# Patient Record
Sex: Male | Born: 1935 | Race: White | Hispanic: No | Marital: Married | Smoking: Current some day smoker
Health system: Southern US, Community
[De-identification: ages and names within clinical notes are randomized; demographics above are authoritative.]

## PROBLEM LIST (undated history)

## (undated) DIAGNOSIS — N200 Calculus of kidney: Secondary | ICD-10-CM

## (undated) DIAGNOSIS — I639 Cerebral infarction, unspecified: Secondary | ICD-10-CM

## (undated) DIAGNOSIS — I10 Essential (primary) hypertension: Secondary | ICD-10-CM

## (undated) HISTORY — PX: CATARACT EXTRACTION: SUR2

## (undated) HISTORY — DX: Calculus of kidney: N20.0

## (undated) HISTORY — DX: Cerebral infarction, unspecified: I63.9

## (undated) HISTORY — PX: CHOLECYSTECTOMY: SHX55

## (undated) HISTORY — DX: Essential (primary) hypertension: I10

---

## 1990-10-22 DIAGNOSIS — I639 Cerebral infarction, unspecified: Secondary | ICD-10-CM

## 1990-10-22 HISTORY — DX: Cerebral infarction, unspecified: I63.9

## 1998-06-21 ENCOUNTER — Inpatient Hospital Stay (HOSPITAL_COMMUNITY): Admission: AD | Admit: 1998-06-21 | Discharge: 1998-06-26 | Payer: Self-pay | Admitting: Otolaryngology

## 1998-07-01 ENCOUNTER — Ambulatory Visit (HOSPITAL_BASED_OUTPATIENT_CLINIC_OR_DEPARTMENT_OTHER): Admission: RE | Admit: 1998-07-01 | Discharge: 1998-07-01 | Payer: Self-pay | Admitting: Otolaryngology

## 2002-02-20 ENCOUNTER — Encounter: Payer: Self-pay | Admitting: Emergency Medicine

## 2002-02-20 ENCOUNTER — Encounter: Payer: Self-pay | Admitting: Neurology

## 2002-02-20 ENCOUNTER — Inpatient Hospital Stay (HOSPITAL_COMMUNITY): Admission: EM | Admit: 2002-02-20 | Discharge: 2002-02-24 | Payer: Self-pay | Admitting: Ophthalmology

## 2002-02-22 ENCOUNTER — Encounter: Payer: Self-pay | Admitting: Neurology

## 2002-02-24 ENCOUNTER — Encounter: Payer: Self-pay | Admitting: Neurology

## 2002-02-24 ENCOUNTER — Inpatient Hospital Stay (HOSPITAL_COMMUNITY)
Admission: AD | Admit: 2002-02-24 | Discharge: 2002-03-13 | Payer: Self-pay | Admitting: Physical Medicine & Rehabilitation

## 2002-04-13 ENCOUNTER — Encounter: Admission: RE | Admit: 2002-04-13 | Discharge: 2002-05-13 | Payer: Self-pay | Admitting: Neurology

## 2006-02-20 ENCOUNTER — Encounter: Payer: Self-pay | Admitting: Otolaryngology

## 2006-03-06 ENCOUNTER — Encounter (INDEPENDENT_AMBULATORY_CARE_PROVIDER_SITE_OTHER): Payer: Self-pay | Admitting: Specialist

## 2006-03-07 ENCOUNTER — Inpatient Hospital Stay (HOSPITAL_COMMUNITY): Admission: AD | Admit: 2006-03-07 | Discharge: 2006-03-12 | Payer: Self-pay | Admitting: General Surgery

## 2006-10-30 ENCOUNTER — Ambulatory Visit (HOSPITAL_COMMUNITY): Admission: RE | Admit: 2006-10-30 | Discharge: 2006-10-30 | Payer: Self-pay | Admitting: Family Medicine

## 2006-11-04 ENCOUNTER — Ambulatory Visit: Payer: Self-pay | Admitting: Internal Medicine

## 2006-11-06 ENCOUNTER — Encounter (INDEPENDENT_AMBULATORY_CARE_PROVIDER_SITE_OTHER): Payer: Self-pay | Admitting: *Deleted

## 2006-11-06 ENCOUNTER — Ambulatory Visit (HOSPITAL_COMMUNITY): Admission: RE | Admit: 2006-11-06 | Discharge: 2006-11-06 | Payer: Self-pay | Admitting: Internal Medicine

## 2006-11-11 ENCOUNTER — Ambulatory Visit: Payer: Self-pay | Admitting: Internal Medicine

## 2006-11-12 ENCOUNTER — Ambulatory Visit: Payer: Self-pay | Admitting: Internal Medicine

## 2006-11-12 LAB — CONVERTED CEMR LAB
Alkaline Phosphatase: 244 units/L — ABNORMAL HIGH (ref 39–117)
Total Bilirubin: 2.7 mg/dL — ABNORMAL HIGH (ref 0.3–1.2)
Total Protein: 6.9 g/dL (ref 6.0–8.3)

## 2006-12-12 ENCOUNTER — Encounter (INDEPENDENT_AMBULATORY_CARE_PROVIDER_SITE_OTHER): Payer: Self-pay | Admitting: Specialist

## 2006-12-12 ENCOUNTER — Ambulatory Visit (HOSPITAL_COMMUNITY): Admission: RE | Admit: 2006-12-12 | Discharge: 2006-12-12 | Payer: Self-pay | Admitting: Gastroenterology

## 2006-12-19 ENCOUNTER — Ambulatory Visit: Payer: Self-pay | Admitting: Gastroenterology

## 2006-12-31 ENCOUNTER — Ambulatory Visit: Payer: Self-pay | Admitting: Internal Medicine

## 2006-12-31 LAB — CONVERTED CEMR LAB
AST: 29 units/L (ref 0–37)
Alkaline Phosphatase: 104 units/L (ref 39–117)
Bilirubin, Direct: 0.5 mg/dL — ABNORMAL HIGH (ref 0.0–0.3)
Total Bilirubin: 1.2 mg/dL (ref 0.3–1.2)
Total Protein: 7.2 g/dL (ref 6.0–8.3)

## 2007-02-03 ENCOUNTER — Ambulatory Visit (HOSPITAL_COMMUNITY): Admission: RE | Admit: 2007-02-03 | Discharge: 2007-02-03 | Payer: Self-pay | Admitting: Family Medicine

## 2008-12-16 ENCOUNTER — Encounter: Payer: Self-pay | Admitting: Family Medicine

## 2009-01-12 ENCOUNTER — Emergency Department (HOSPITAL_COMMUNITY): Admission: EM | Admit: 2009-01-12 | Discharge: 2009-01-12 | Payer: Self-pay | Admitting: Emergency Medicine

## 2009-04-22 ENCOUNTER — Ambulatory Visit: Payer: Self-pay | Admitting: Family Medicine

## 2009-04-22 DIAGNOSIS — Z8679 Personal history of other diseases of the circulatory system: Secondary | ICD-10-CM | POA: Insufficient documentation

## 2009-04-22 DIAGNOSIS — I10 Essential (primary) hypertension: Secondary | ICD-10-CM

## 2009-06-03 DIAGNOSIS — F068 Other specified mental disorders due to known physiological condition: Secondary | ICD-10-CM

## 2009-10-26 ENCOUNTER — Ambulatory Visit: Payer: Self-pay | Admitting: Family Medicine

## 2009-10-26 DIAGNOSIS — L851 Acquired keratosis [keratoderma] palmaris et plantaris: Secondary | ICD-10-CM

## 2009-10-26 DIAGNOSIS — L570 Actinic keratosis: Secondary | ICD-10-CM

## 2009-10-26 DIAGNOSIS — M545 Low back pain, unspecified: Secondary | ICD-10-CM | POA: Insufficient documentation

## 2009-10-26 DIAGNOSIS — R252 Cramp and spasm: Secondary | ICD-10-CM

## 2009-10-27 LAB — CONVERTED CEMR LAB
CO2: 32 meq/L (ref 19–32)
Chloride: 106 meq/L (ref 96–112)
Potassium: 3.4 meq/L — ABNORMAL LOW (ref 3.5–5.1)

## 2010-03-09 ENCOUNTER — Telehealth: Payer: Self-pay | Admitting: Family Medicine

## 2010-04-26 ENCOUNTER — Ambulatory Visit: Payer: Self-pay | Admitting: Family Medicine

## 2010-04-26 DIAGNOSIS — E876 Hypokalemia: Secondary | ICD-10-CM | POA: Insufficient documentation

## 2010-04-27 LAB — CONVERTED CEMR LAB
BUN: 16 mg/dL (ref 6–23)
Calcium: 9 mg/dL (ref 8.4–10.5)
Chloride: 107 meq/L (ref 96–112)
Cholesterol: 151 mg/dL (ref 0–200)
Creatinine, Ser: 1 mg/dL (ref 0.4–1.5)
Glucose, Bld: 96 mg/dL (ref 70–99)
HDL: 42.9 mg/dL (ref >39.00)
Potassium: 3.6 meq/L (ref 3.5–5.1)
Sodium: 147 meq/L — ABNORMAL HIGH (ref 135–145)
Total CHOL/HDL Ratio: 4

## 2010-10-27 ENCOUNTER — Ambulatory Visit
Admission: RE | Admit: 2010-10-27 | Discharge: 2010-10-27 | Payer: Self-pay | Source: Home / Self Care | Attending: Family Medicine | Admitting: Family Medicine

## 2010-10-27 DIAGNOSIS — L259 Unspecified contact dermatitis, unspecified cause: Secondary | ICD-10-CM | POA: Insufficient documentation

## 2010-11-21 NOTE — Assessment & Plan Note (Signed)
Summary: 6 MONTH ROV/NJR   Vital Signs:  Patient profile:   77 year old male Weight:      165 pounds Temp:     98.1 degrees F oral BP sitting:   160 / 60  (left arm) Cuff size:   regular  Vitals Entered By: Kathrynn Speed CMA (April 26, 2010 10:16 AM) CC: 6 month rov /sr, Hypertension Management  Does patient need assistance? Ambulation Impaired:Risk for fall   Serial Vital Signs/Assessments:  Time      Position  BP       Pulse  Resp  Temp     By                     138/80                         Evelena Peat MD   History of Present Illness: Patient here for followup hypertension. Past history remotely of cerebrovascular accident. Patient also has some cognitive decline that family not sure of the benefit of Aricept and he would like to discontinue. History of slightly low potassium of 3.4  last visit. Patient eats one banana per day. Excellent appetite.  Occasional dizziness with standing. He walks with a cane. No recent falls. Denies chest pain. Lipids have been OK in past but not checked in some time.  Hypertension History:      He denies headache, chest pain, palpitations, dyspnea with exertion, orthopnea, peripheral edema, visual symptoms, neurologic problems, and syncope.        Positive major cardiovascular risk factors include male age 44 years old or older, hypertension, and current tobacco user.     Current Medications (verified): 1)  Plavix 75 Mg Tabs (Clopidogrel Bisulfate) .... Once Daily 2)  Lisinopril-Hydrochlorothiazide 20-12.5 Mg Tabs (Lisinopril-Hydrochlorothiazide) .... Once Daily 3)  Aricept 10 Mg Tabs (Donepezil Hcl) .... As Needed 4)  Aristospan Intralesional 5 Mg/ml Susp (Triamcinolone Hexacetonide) 5)  Triamcinolone Acetonide 0.1 % Crea (Triamcinolone Acetonide) .... Apply To Dry Skin As Needed As Directed  Allergies (verified): No Known Drug Allergies  Past History:  Past Medical History: Last updated: 04/22/2009 Chicken  pox Hypertension Cerebrovascular accident, hx of Kidney stones  Past Surgical History: Last updated: 04/22/2009 Cholecystectomy 2007  Social History: Last updated: 04/22/2009 Retired Married Current Smoker, 3-4 daily Alcohol use-no PMH-FH-SH reviewed for relevance  Review of Systems  The patient denies anorexia, fever, weight loss, chest pain, dyspnea on exertion, prolonged cough, headaches, hemoptysis, abdominal pain, melena, and hematochezia.    Physical Exam  General:  Well-developed,well-nourished,in no acute distress; alert,appropriate and cooperative throughout examination Head:  Normocephalic and atraumatic without obvious abnormalities. No apparent alopecia or balding. Ears:  External ear exam shows no significant lesions or deformities.  Otoscopic examination reveals clear canals, tympanic membranes are intact bilaterally without bulging, retraction, inflammation or discharge. Hearing is grossly normal bilaterally. Mouth:  Oral mucosa and oropharynx without lesions or exudates.  Teeth in good repair. Neck:  No deformities, masses, or tenderness noted. Lungs:  Normal respiratory effort, chest expands symmetrically. Lungs are clear to auscultation, no crackles or wheezes. Heart:  normal rate and regular rhythm.   Extremities:  no edema Neurologic:  cranial nerves II-XII intact and strength normal in all extremities.   Psych:  normally interactive, good eye contact, not anxious appearing, and not depressed appearing.     Impression & Recommendations:  Problem # 1:  HYPERTENSION (ICD-401.9)  His updated medication  list for this problem includes:    Lisinopril-hydrochlorothiazide 20-12.5 Mg Tabs (Lisinopril-hydrochlorothiazide) ..... Once daily  Problem # 2:  DEMENTIA (ICD-294.8) Family and pt wish to d/c Aricept.  Problem # 3:  HYPOKALEMIA (ICD-276.8)  Orders: Venipuncture (29562) TLB-BMP (Basic Metabolic Panel-BMET) (80048-METABOL)  Problem # 4:   CEREBROVASCULAR ACCIDENT, HX OF (ICD-V12.50) recheck lipids.  Cont plavix.  Pt is encouraged to stop smoking. Orders: Venipuncture (13086) TLB-Lipid Panel (80061-LIPID) Prescription Created Electronically [phone])  Complete Medication List: 1)  Plavix 75 Mg Tabs (Clopidogrel bisulfate) .... Once daily 2)  Lisinopril-hydrochlorothiazide 20-12.5 Mg Tabs (Lisinopril-hydrochlorothiazide) .... Once daily 3)  Aristospan Intralesional 5 Mg/ml Susp (Triamcinolone hexacetonide) 4)  Triamcinolone Acetonide 0.1 % Crea (Triamcinolone acetonide) .... Apply to dry skin as needed as directed  Hypertension Assessment/Plan:      The patient's hypertensive risk group is category B: At least one risk factor (excluding diabetes) with no target organ damage.  Today's blood pressure is 160/60.    Patient Instructions: 1)  Please schedule a follow-up appointment in 6 months .  2)  Consider discontinuing Aricept Prescriptions: PLAVIX 75 MG TABS (CLOPIDOGREL BISULFATE) once daily  #30 x 11   Entered and Authorized by:   Evelena Peat MD   Signed by:   Evelena Peat MD on 04/26/2010   Method used:   [facility] to        [facility] [phone]* (retail)       [address]       [place], [place]  [address]       Ph: [phone] or [phone]       Fax: [phone]   RxID:   [id number]

## 2010-11-21 NOTE — Assessment & Plan Note (Signed)
Summary: 6 month roa//LH....PT [facility] // RS   Vital Signs:  Patient profile:   77 year old male Weight:      170 pounds Temp:     97.9 degrees F oral BP sitting:   130 / 74  (left arm) Cuff size:   regular  Vitals Entered By: Sid Falcon LPN (October 26, 2009 11:50 AM) CC: 6 month follow-up, Hypertension Management   History of Present Illness: Patient here for medical followup. Problems include history of mild cognitive impairment, stroke, and hypertension. Medications are all reviewed. No side effects. Compliant with medications.  Denies any recent chest pain, falls, dyspnea, claudication symptoms, or dysuria. History of dry skin and request refill triamcinolone ointment.  Patient has history of frequent leg cramps at night. Decreased fluid intake at night. No obstructive urine symptoms. No recent electrolytes or blood work.  still smokes less than one half pack cigarettes per day.  He has some low back pains which seem to be somewhat worse with walking. Takes Tylenol. No difficulty sleeping related to this. This is a dull achy pain. No weakness or numbness lower extremities no loss of bladder or bowel control  Hypertension History:      He denies headache, chest pain, palpitations, dyspnea with exertion, orthopnea, PND, peripheral edema, visual symptoms, neurologic problems, syncope, and side effects from treatment.  He notes no problems with any antihypertensive medication side effects.        Positive major cardiovascular risk factors include male age 52 years old or older, hypertension, and current tobacco user.     Allergies (verified): No Known Drug Allergies  Past History:  Past Medical History: Last updated: 04/22/2009 Chicken pox Hypertension Cerebrovascular accident, hx of Kidney stones  Past Surgical History: Last updated: 04/22/2009 Cholecystectomy 2007  Social History: Last updated: 04/22/2009 Retired Married Current Smoker, 3-4 daily Alcohol  use-no  Review of Systems  The patient denies anorexia, fever, weight loss, weight gain, chest pain, syncope, dyspnea on exertion, peripheral edema, prolonged cough, headaches, hemoptysis, abdominal pain, melena, hematochezia, incontinence, and difficulty walking.    Physical Exam  General:  alert cooperative in no distress Ears:  External ear exam shows no significant lesions or deformities.  Otoscopic examination reveals clear canals, tympanic membranes are intact bilaterally without bulging, retraction, inflammation or discharge. Hearing is grossly normal bilaterally. Mouth:  Oral mucosa and oropharynx without lesions or exudates.  Teeth in good repair. Neck:  No deformities, masses, or tenderness noted. Lungs:  somewhat diminished breath sounds throughout. No wheezes arousals noted. Heart:  occasional irregular beat. Normal rate. No gallop Extremities:  no peripheral edema noted Skin:  Area of hyperkeratotic skin dorsum right hand but no nodular or ulcerative changes. Psych:  normally interactive, good eye contact, not anxious appearing, and not depressed appearing.     Impression & Recommendations:  Problem # 1:  HYPERTENSION (ICD-401.9)  His updated medication list for this problem includes:    Lisinopril-hydrochlorothiazide 20-12.5 Mg Tabs (Lisinopril-hydrochlorothiazide) ..... Once daily  Orders: TLB-BMP (Basic Metabolic Panel-BMET) (80048-METABOL)  Problem # 2:  CEREBROVASCULAR ACCIDENT, HX OF (ICD-V12.50) Hx of.  Strongly encouraged to d/c smoking.  Problem # 3:  LEG CRAMPS (ICD-729.82)  increase fluid intake and check electrolytes  Orders: TLB-BMP (Basic Metabolic Panel-BMET) (80048-METABOL)  Problem # 4:  DRY SKIN (ICD-701.1) refill triamcinolone ointment  Problem # 5:  ACTINIC KERATOSIS (ICD-702.0) discussed treatment options including liquid nitrogen they wish to wait at this time  Problem # 6:  LUMBAGO (ICD-724.2) By  hx, most likely lumbar stenosis.   Discussed further evaluation and possible rx options and at this point he wishes to wait.  Problem # 7:  Preventive Health Care (ICD-V70.0) Still hasn't had flu vaccine but he consents today.  Complete Medication List: 1)  Plavix 75 Mg Tabs (Clopidogrel bisulfate) .... Once daily 2)  Lisinopril-hydrochlorothiazide 20-12.5 Mg Tabs (Lisinopril-hydrochlorothiazide) .... Once daily 3)  Aricept 10 Mg Tabs (Donepezil hcl) .... Once daily as directed 4)  Aristospan Intralesional 5 Mg/ml Susp (Triamcinolone hexacetonide)  Other Orders: Flu Vaccine 73yrs + (40981) Administration Flu vaccine - MCR (X9147)  Hypertension Assessment/Plan:      The patient's hypertensive risk group is category B: At least one risk factor (excluding diabetes) with no target organ damage.  Today's blood pressure is 130/74.    Patient Instructions: 1)  Please schedule a follow-up appointment in 6 months .  2)  Stop smoking tips: Choose a quit date. Cut down before the quit date. Decide what you will do as a substitute when you feel the urge to smoke(gum, toothpick, exercise).   Flu Vaccine Consent Questions     Do you have a history of severe allergic reactions to this vaccine? no    Any prior history of allergic reactions to egg and/or gelatin? no    Do you have a sensitivity to the preservative Thimersol? no    Do you have a past history of Guillan-Barre Syndrome? no    Do you currently have an acute febrile illness? no    Have you ever had a severe reaction to latex? no    Vaccine information given and explained to patient? yes    Are you currently pregnant? no    Lot Number:[id number]   Exp Date:04/20/2010   Site Given  Left Deltoid IMdflu

## 2010-11-21 NOTE — Progress Notes (Signed)
Summary: REFILL REQUEST (LISONO-HCTZ)  Phone Note Refill Request Message from:  Patient on Mar 09, 2010 11:04 AM  Refills Requested: Medication #1:  LISINOPRIL-HYDROCHLOROTHIAZIDE 20-12.5 MG TABS once daily   Notes: Q[facility] - 30.[facility] [place]    Initial call taken by: Debbra Riding,  Mar 09, 2010 11:05 AM    Prescriptions: LISINOPRIL-HYDROCHLOROTHIAZIDE 20-12.5 MG TABS (LISINOPRIL-HYDROCHLOROTHIAZIDE) once daily  #30 x 11   Entered by:   Sid Falcon LPN   Authorized by:   Evelena Peat MD   Signed by:   Sid Falcon LPN on 45/40/9811   Method used:   Electronically to        [facility]  [address]* (retail)       [address]       [place], [place]  [address]       Ph: [phone]       Fax: [phone]   RxID:   [id number] LISINOPRIL-HYDROCHLOROTHIAZIDE 20-12.5 MG TABS (LISINOPRIL-HYDROCHLOROTHIAZIDE) once daily  #30 x 11   Entered by:   Sid Falcon LPN   Authorized by:   Evelena Peat MD   Signed by:   Sid Falcon LPN on 66/44/0347   Method used:   [facility]        [facility] [phone]* (retail)       [address]       [place], [place]  [address]       Ph: [phone] or [phone]       Fax: [phone]   RxID:   [id number]  Pt changed pharmacy, cancelled Rx [facility] LPN  Mar 09, 2010 12:04 PM

## 2010-11-23 NOTE — Assessment & Plan Note (Signed)
Summary: 6 month rov/njr/son rescd//ccm   Vital Signs:  Patient profile:   77 year old male Weight:      163 pounds Temp:     98.1 degrees F oral BP sitting:   130 / 82  (left arm) Cuff size:   regular  Vitals Entered By: Sid Falcon LPN (October 27, 2010 10:13 AM)  History of Present Illness: Here for medical follow up.   Hypertension and hx CVA.  He is compliant with meds and denies any side effects.  No recent chest pain or any focal neurologic concerns.  New problem of scaly, pruritic rash both ear canals.  Has tried lotions without  improvement.  Remote hx of CVA several years ago.  REmains on plavix.    Hypertension History:      He denies headache, chest pain, palpitations, dyspnea with exertion, orthopnea, PND, peripheral edema, visual symptoms, neurologic problems, syncope, and side effects from treatment.        Positive major cardiovascular risk factors include male age 27 years old or older, hypertension, and current tobacco user.     Allergies (verified): No Known Drug Allergies  Past History:  Past Medical History: Last updated: 04/22/2009 Chicken pox Hypertension Cerebrovascular accident, hx of Kidney stones  Past Surgical History: Last updated: 04/22/2009 Cholecystectomy 2007  Social History: Last updated: 04/22/2009 Retired Married Current Smoker, 3-4 daily Alcohol use-no  Risk Factors: Smoking Status: current (04/22/2009) PMH-FH-SH reviewed for relevance  Review of Systems  The patient denies anorexia, fever, weight loss, hoarseness, chest pain, syncope, peripheral edema, prolonged cough, headaches, hemoptysis, and abdominal pain.    Physical Exam  General:  Well-developed,well-nourished,in no acute distress; alert,appropriate and cooperative throughout examination Head:  normocephalic and atraumatic.   Ears:  scaly rash ext ear canals Mouth:  Oral mucosa and oropharynx without lesions or exudates.  Teeth in good repair. Neck:  No  deformities, masses, or tenderness noted. Lungs:  Normal respiratory effort, chest expands symmetrically. Lungs are clear to auscultation, no crackles or wheezes. Heart:  normal rate and regular rhythm.   Extremities:  no edema. Neurologic:  alert & oriented X3 and cranial nerves II-XII intact.   Cervical Nodes:  No lymphadenopathy noted Psych:  normally interactive, good eye contact, not anxious appearing, and not depressed appearing.     Impression & Recommendations:  Problem # 1:  HYPERTENSION (ICD-401.9) Assessment Unchanged  His updated medication list for this problem includes:    Lisinopril-hydrochlorothiazide 20-12.5 Mg Tabs (Lisinopril-hydrochlorothiazide) ..... Once daily  Problem # 2:  ECZEMA (ICD-692.9) Assessment: New  The following medications were removed from the medication list:    Aristospan Intralesional 5 Mg/ml Susp (Triamcinolone hexacetonide) His updated medication list for this problem includes:    Elocon 0.1 % Lotn (Mometasone furoate) ..... Use as directed once daily as needed  Problem # 3:  CEREBROVASCULAR ACCIDENT, HX OF (ICD-V12.50)  Problem # 4:  Preventive Health Care (ICD-V70.0) cannot confirm last PVX and no flu vaccine yet this year so both are given today after pt consent.  Complete Medication List: 1)  Plavix 75 Mg Tabs (Clopidogrel bisulfate) .... Once daily 2)  Lisinopril-hydrochlorothiazide 20-12.5 Mg Tabs (Lisinopril-hydrochlorothiazide) .... Once daily 3)  Elocon 0.1 % Lotn (Mometasone furoate) .... Use as directed once daily as needed  Other Orders: Flu Vaccine 6yrs + ([address]) Admin 1st Vaccine (60454) Pneumococcal Vaccine (09811) Admin of Any Addtl Vaccine ([address]1478)  Hypertension Assessment/Plan:      The patient's hypertensive risk group is category B: At  least one risk factor (excluding diabetes) with no target organ damage.  His calculated 10 year risk of coronary heart disease is 22 %.  Today's blood pressure is 130/82.    Patient  Instructions: 1)  Please schedule a follow-up appointment in 6 months .  Prescriptions: LISINOPRIL-HYDROCHLOROTHIAZIDE 20-12.5 MG TABS (LISINOPRIL-HYDROCHLOROTHIAZIDE) once daily  #90 x 3   Entered and Authorized by:   Evelena Peat MD   Signed by:   Evelena Peat MD on 10/27/2010   Method used:   [facility]ronically to        [facility]  [address] (retail)       [address]       [place], [place]  [address]       Ph: [phone]       Fax: [phone]   RxID:   [id number] PLAVIX 75 MG TABS (CLOPIDOGREL BISULFATE) once daily  #90 x 3   Entered and Authorized by:   Evelena Peat MD   Signed by:   Evelena Peat MD on 10/27/2010   Method used:   Electronically to        [facility]  [address]* (retail)       [address]       [place], [place]  [address]       Ph: [phone]       Fax: [phone]   RxID:   [id number] ELOCON 0.1 % LOTN (MOMETASONE FUROATE) use as directed once daily as needed  #1 bottle x 5   Entered and Authorized by:   Evelena Peat MD   Signed by:   Evelena Peat MD on 10/27/2010   Method used:   Electronically to        [facility]  [address]* (retail)       [address]       [place], [place]  [address]       Ph: [phone]       Fax: [phone]   RxID:   [id number]    Orders Added: 1)  Flu Vaccine 19yrs + [51761] 2)  Admin 1st Vaccine [60737] 3)  Pneumococcal Vaccine [10626] 4)  Admin of Any Addtl Vaccine [90472] 5)  Est. Patient Level IV [94854]   Immunizations Administered:  Influenza Vaccine # 1:    Vaccine Type: Fluvax 3+    Site: left deltoid    Mfr: Sanofi Pasteur    Dose: 0.5 ml    Route: IM    Given by: Sid Falcon LPN    Exp. Date: 03/23/2011    Lot #: [id number]  Pneumonia Vaccine:    Vaccine Type: Pneumovax    Site: right deltoid    Mfr: Merck    Dose: 0.5 ml    Route: IM    Given by: Sid Falcon LPN    Exp. Date: 02/20/2011    Lot #: [id number]  Flu  Vaccine Consent Questions:    Do you have a history of severe allergic reactions to this vaccine? no    Any prior history of allergic reactions to egg and/or gelatin? no    Do you have a sensitivity to the preservative Thimersol? no    Do you have a past history of Guillan-Barre Syndrome? no    Do you currently have an acute febrile illness? no    Have you ever had a severe  reaction to latex? no    Vaccine information given and explained to patient? yes   Immunizations Administered:  Influenza Vaccine # 1:    Vaccine Type: Fluvax 3+    Site: left deltoid    Mfr: Sanofi Pasteur    Dose: 0.5 ml    Route: IM    Given by: Sid Falcon LPN    Exp. Date: 03/23/2011    Lot #: [id number]  Pneumonia Vaccine:    Vaccine Type: Pneumovax    Site: right deltoid    Mfr: Merck    Dose: 0.5 ml    Route: IM    Given by: Sid Falcon LPN    Exp. Date: 02/20/2011    Lot #: [id number]

## 2011-02-01 LAB — DIFFERENTIAL
Basophils Relative: 0 % (ref 0–1)
Eosinophils Absolute: 0.4 10*3/uL (ref 0.0–0.7)
Lymphocytes Relative: 16 % (ref 12–46)
Monocytes Relative: 10 % (ref 3–12)
Neutro Abs: 7.5 10*3/uL (ref 1.7–7.7)

## 2011-02-01 LAB — CBC
Platelets: 117 10*3/uL — ABNORMAL LOW (ref 150–400)
RBC: 5.25 MIL/uL (ref 4.22–5.81)
RDW: 13.6 % (ref 11.5–15.5)
WBC: 10.6 10*3/uL — ABNORMAL HIGH (ref 4.0–10.5)

## 2011-03-09 NOTE — Assessment & Plan Note (Signed)
[name]                         GASTROENTEROLOGY OFFICE NOTE   NAME:[name], [name]                       MRN:          [id number]  DATE:11/04/2006                            DOB:          May 22, 1921    November 04, 2006   CHIEF COMPLAINT:  Jaundice, biliary obstruction seen on CT, question  pancreatic tumor or ampullary tumor.   REFERRING PHYSICIAN:  Evelena [name], M.D.   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  At  this time, he has an intra and extrahepatic dilation on CT scan with  jaundice.  He has been losing some weight as well.  The CT suggests a  possible small pancreatic tumor versus ampullary lesion.  Given the  history of biliary intervention with the omental patch, question if he  has developed some sort of scarring with a possible stricture as well.   RECOMMENDATIONS:  Plan ERCP to investigate, possible stenting or  sphincterotomy.  He has been taking Plavix intermittently.  I know he  did not have any Friday which was three days ago, so we will wait  another two days for his procedure.  Plan for ERCP in two days with  possible stenting or sphincterotomy.  Risks, benefits, and alternatives  are explained.  IV Unasyn will be given on call.   HISTORY:  This man had a cholecystectomy in May of 2007, because of  cholecystitis and cholelithiasis.  Attempted laparoscopic  cholecystectomy was carried out and had to be converted to an open  procedure to repair the cholecystoduodenal fistula as well as remove the  gallbladder.  He did well until recently.  He started to notice some  jaundice.  It is painless.  There is really no pruritus except some on  his legs where they have swollen.  He has been losing weight, about 15  pounds since may of last year.  Appetite is off  and on it sounds like.  He is here with his wife today.  There are no fever or chills reported.  Again, no pain.   PAST MEDICAL HISTORY:  1. Hypertension.  2. Nephrolithiasis.  3. Two strokes.  4. Macular degeneration.  5. Cholecystectomy as above.   MEDICATIONS:  1. Hydrochlorothiazide 12.5 mg daily.  2. Nifedipine ER 30 mg daily.  3. Plavix which he takes intermittent his wife says.   ALLERGIES:  NO KNOWN DRUG ALLERGIES.   FAMILY HISTORY:  Review is noncontributory.   SOCIAL HISTORY:  He is married.  He is retired from [employer].  One son and one daughter.  He still smokes a few cigarettes a day.  No  other tobacco, alcohol or drug use.   REVIEW OF SYSTEMS:  Notable for the pedal edema.  He uses a cane to  walk, the macular degeneration gives him trouble.  He wears eye glasses.  He has dentures.   PHYSICAL EXAMINATION:  GENERAL APPEARANCE:  A chronically ill, elderly  white male.  VITAL SIGNS:  Height 5 feet 11 inches, weight 149 pounds.  Blood  pressure 126/60, pulse 72 with occasional skipped beats.  HEENT:  Eyes:  Scleral icterus.  Mouth:  Palate shows some icterus.  He  has dentures.  NECK:  Supple without thyromegaly or mass.  CHEST:  Clear, resonant.  CARDIOVASCULAR:  S1 and S2, no murmurs, rubs, or gallops are heard.  There are occasional missed beats at times.  ABDOMEN:  Soft, nontender, there is a cholecystectomy scar as well as  laparoscopy scars.  There is no organomegaly or masses.  The abdomen is  a little doughy and with lose skin consistent with weight loss.  LYMPHATIC:  No neck, supraclavicular or inguinal adenopathy detected.  EXTREMITIES:  She has 1+ bilateral lower extremity edema.  SKIN:  Some chronic venostasis changes as well as some patchy areas of  sun damage and actinic lesions on the sun exposed areas.  NEUROLOGIC/PSYCHIATRIC:  He is alert and oriented x3.   LABORATORY DATA:  October 29, 2006, alkaline phosphatase 626, total   bilirubin 5.8, AST 188, ALT 102.  Albumin 2.3, total protein 6.8.  The  remainder of the CMET is normal.  MCV 101.  Hemoglobin 15.1.  TSH is  normal at 2.455.  White count 8.6, platelets 164.   CT scan showed intra and extrahepatic biliary dilation with common bile  duct up to 1.3 cm.  Majority of the pancreas showing fatty change and  atrophy.  There is prominent area in the head of the pancreas which  could be a tumor associated with the head of the pancreas such as the  ampulla versus the pancreatic tumor. He has mildly enlarged lymph nodes  in the aortic caval region, bilateral renal cysts and multiple lower  pole left renal calculi.   I appreciate the opportunity to care for this patient.     Iva Boop, MD,FACG  Electronically Signed    CEG/MedQ  DD: 11/04/2006  DT: 11/04/2006  Job #: [id number]   cc:   Ollen Gross. Vernell Morgans, M.D.

## 2011-03-09 NOTE — Op Note (Signed)
NAMEROALD, LUKACS NO.:  [id number]   MEDICAL RECORD NO.:  [id number]          PATIENT TYPE:  INP   LOCATION:  5501                         FACILITY:  [facility]   PHYSICIAN:  Ollen Gross. Vernell Morgans, M.D. DATE OF BIRTH:  Jul 25, 1921   DATE OF PROCEDURE:  03/06/2006  DATE OF DISCHARGE:                                 OPERATIVE REPORT   PREOPERATIVE DIAGNOSIS:  Gallstones.   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   PROCEDURES:  Aborted laparoscopic, subsequent open cholecystectomy with  repair of cholecystoduodenal fistula using an omental patch.   SURGEON:  Ollen Gross. Carolynne Edouard, M.D.   ASSISTANT:  Clovis Pu. Cornett, M.D.   ANESTHESIA:  General endotracheal.   PROCEDURE:  After informed consent was obtained, the patient was brought to  the operating room and placed in supine position on the operating room  table.  After induction of general anesthesia, the patient's abdomen was  prepped with Betadine and draped in usual sterile manner.  The area above  the umbilicus was infiltrated with 0.25% Marcaine with epinephrine.  A small  incision was made with a 15 blade knife.  This incision was carried down  through the subcutaneous tissue bluntly hemostat and [employer] retractors  until the linea alba was identified and was incised with a 15 blade knife  and each side was grasped with Kocher clamps, elevated anteriorly.  Preperitoneal space was then probed bluntly with hemostat until the  peritoneum was opened and access was gained to the abdominal cavity.  The 0  Vicryl pursestring stitch was placed in the fascia surrounding the opening  site.  Cannula was placed through the opening anchored in place with the  previous placed Vicryl pursestring stitch. The abdomen was then insufflated  carbon dioxide without difficulty.  A laparoscope was inserted through the  Hasson cannula and the right upper quadrant was inspected.  The  gallbladder  appeared to be extremely inflamed and densely adherent to the anterior  abdominal wall and to the duodenum.  Many of the omental adhesions for  seemed filmy enough to come down by some blunt dissection and sharp  dissection with the laparoscopic scissors and at this point two sites for  two 5 mm ports chosen on the right side of the abdomen and each of these  areas infiltrated with 0.25% Marcaine with epinephrine.  Small stab  incisions were made with a 15 blade knife and 5 mm ports were placed bluntly  through these incisions into the abdominal cavity under direct vision.  A  site was then chosen in the epigastric area for placement of 10 mm port away  from where the gallbladder was adherent. This area was infiltrated with  0.25% Marcaine.  A small incision was made with 15 blade knife and 10 mm  port was placed bluntly through this incision into the abdominal cavity  under direct vision. Again the omental adhesions to the abdominal wall were  taken down sharply through one of the lateral 5 mm ports with a laparoscopic  scissors as well as with some blunt  dissection. The omental adhesions to the  gallbladder were able to be taken down by blunt dissection with a dissector  but the duodenum was densely adherent to the gallbladder and appeared as  though there was a fistulous connection between the gallbladder and  duodenum. As this area was divided sharply with the scissors, the sharp  dissection was carried out more in the gallbladder wall in order to try to  avoid the duodenum but it became readily apparent there was a definite  fistulous connection in this area. At this point the laparoscopic portion of  procedure was stopped. A right subcostal incision was made. This incision  was carried down through the skin, subcutaneous tissue sharply with  electrocautery.  The fascia and muscles of the anterior abdominal wall also  divided sharply with electrocautery, thereby gaining  access to the abdominal  cavity.  A Buchwalter retractor was deployed. The gallbladder was then  dissected free from the anterior abdominal wall sharply with electrocautery.  Once this was accomplished the fistulous connection between the gallbladder  and the duodenum was identified, taken down sharply with the Metzenbaum  scissors.  The small hole and the duodenum was then repaired with  interrupted 3-0 silk stitches, a tongue of omentum readily reached this area  and the omentum was stitched over top of the repair also with interrupted 3-  0 silk stitches.  At this point the attention was turned to the gallbladder.  The gallbladder was dissected free from the liver bed using a top-down  technique. This was done sharply with electrocautery. The entire area was  very inflamed from examining from within the gallbladder.  It was not clear  where the cystic duct was and it was decided at this point because of the  amount of inflammation to cut the gallbladder off at its base and oversew  it. The gallbladder was then divided sharply at its base using  electrocautery.  The cystic artery was identified and was controlled with  interrupted 3-0 silk stitches in a figure-of-eight fashion. The area of the  cystic duct was identified but we were unable to cannulate it. At this point  some 2-0 Vicryl stitches were used to oversew the base of the gallbladder  with interrupted figure-of-eight stitches.  Once this was accomplished,  there appeared to be no bile leak and the area was completely hemostatic.  The liver bed was fulgurated with electrocautery and two large pieces of the  Surgicel were placed in the liver bed. The duodenal repair was examined  again and appeared to be intact and healthy. At this point a tonsil clamp  was placed through one of the 5 mm openings laterally on the abdominal wall  and brought in a 19-French round Blake drain was brought through the abdominal wall. The end of the  drain was placed in the liver bed between the  duodenal repair and the stump of the gallbladder. The drain was anchored to  the skin with a 3-0 nylon stitch and placed to bulb suction.  The abdomen  was irrigated with copious amounts of saline.  All the ports had previously  been removed and the fascial defect at the supraumbilical port site was  closed with 0 Vicryl pursestring stitch.  The abdominal wall of the right  subcostal incision was closed in two layers with running #1 PDS suture.  Each layer was irrigated after closing. The subcutaneous tissue was also  irrigated copiously with saline and the skin incisions were all  closed with  staples. Sterile dressings were applied.  The patient tolerated the  procedure well. At the end of the case all needle, sponge, and instrument  counts were correct.  The patient was awakened and taken to recovery in  stable condition.      Ollen Gross. Vernell Morgans, M.D.  Electronically Signed     PST/MEDQ  D:  03/11/2006  T:  03/11/2006  Job:  [id number]

## 2011-03-09 NOTE — Assessment & Plan Note (Signed)
[facility]                         GASTROENTEROLOGY OFFICE NOTE   NAME:STEELEGraves, Nipp                       MRN:          [id number]  DATE:11/12/2006                            DOB:          Oct 02, 1921    Mr. Widmer was seen on November 04, 2006.  He had obstructive jaundice.  He is here for followup after his ERCP.   HISTORY:  At ERCP, he had what I thought was an ampullary lesion.  It  was difficult, but I was able to perform a sphincterotomy and place a  stent and get good bile drainage.  His appetite has increased quite a  bit.  He has had no problems after that, and his jaundice appears to be  dissipating.  The biopsies returned showing benign small bowel mucosa,  no evidence of adenoma or cancer.  I have explained all this to the  patient, his wife, and his son today.  See my dictation of November 04, 2006 for further past medical history, etc.   Weight 150 pounds, pulse 56, blood pressure 100/48.  The eyes are anicteric.  The skin is without jaundice.   ASSESSMENT:  Obstructive jaundice, treated.  Etiology not clear.  The  possibility of papillary stenosis versus undiagnosed adenoma or cancer  of the ampullae or the distal pancreas still exists.   PLAN:  I think an endoscopic ultrasound, plus/minus metal stenting,  plus/minus sphincterotomy extension without stent makes the most sense  at this point.  Will discuss with Dr. Christella Hartigan, our endoscopic ultrasound  expert.  I think it would be important to understand the exact nature of  this process, as to how to treat it differently.  Note, whatever is at  the papilla, it was not firm, and it may just be a papillary stenosis  issue.  Pending discussion with Dr. Christella Hartigan, we may proceed with ERCP  alone without an endoscopic ultrasound.   Will check hepatic function panel today.     Iva Boop, MD,FACG  Electronically Signed    CEG/MedQ  DD: 11/12/2006  DT: 11/12/2006  Job #:  [id number]   cc:   Evelena Peat, M.D.

## 2011-03-09 NOTE — Assessment & Plan Note (Signed)
[name] returns to clinic today for follow-up evaluation.  He is an 77-  year-old gentleman who was seen in the office today accompanied by his son.   The patient originally underwent a right above knee amputation by Dr.  Edilia Bo May 20, 2003.  Eventually was discharged from the rehabilitation  unit June 18, 2003.  Unfortunately, the patient was readmitted into the  hospital the next day, June 19, 2003, after he fell and fractured his left  hip.  He underwent open reduction and internal fixation by Dr. Thurston Hole at  that time.  The patient eventually was discharged to a skilled nursing  facility and went home from there approximately November of 2004.  He  eventually was allowed increased weightbearing approximately the end of  December 2004, for his left lower extremity. He received home health  physical therapy through most of January 2005.   The patient is due to follow up with Dr. Thurston Hole within the next week or so.  He has been putting essentially as much weight as tolerated on the left  lower extremity as he was doing some walking with the home health physical  therapist through January.   The patient has been recently started on Lasix for left lower extremity and  that was started by his cardiologist.   The patient is using his right above knee prosthesis approximately two hours  a day at the present time.  He lives on his own but his son comes by on a  regular basis to check on him and do what he needs.  The patient did stand  in the parallel bars with Robin __________ for approximately 8-1/2 minutes  recently but had to sit down because of shortness of breath and feeling that  his left leg was weak.  He complains of no pain whatsoever in his right  lower extremity where he has had that amputation.  He continues to use  inhalers including albuterol, Atrovent and Azmacort on a daily basis along  with theophylline.  He does have oxygen at home but he has not been using it  as he had been in the hospital.   MEDICATIONS:  1. Theophyllin 300 mg b.i.d.  2. Ranitidine 150 mg b.i.d.  3. Digitek 0.125 mg daily.  4. Atrovent metered dose inhaler p.r.n.  5. Albuterol metered dose inhaler p.r.n.  6. Azmacort metered dose inhaler b.i.d.  7. Vitamin B12 tablet daily.  8. Lasix 80 mg daily.  9. Potassium chloride daily.   PHYSICAL EXAMINATION:  GENERAL APPEARANCE:  A reasonably well-appearing  elderly male.  VITAL SIGNS:  Blood pressure 112/68 with pulse of 107, respiratory rate 18  with O2 saturation 96% on room air.  NEUROLOGIC:  He has strength of 5-/5 throughout the bilateral upper  extremities.  Bulk and tone were normal and reflexes were 2+ and  symmetrical.  Left lower extremity strength was essentially 4+/5.  Right  lower extremity examination showed a well healed above knee amputation site.  The patient did require some help with donning and doffing of his  prosthesis.  He did come to the standing position with minimal assist and  stood for approximately two minutes.   IMPRESSION:  1. 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  2. History of left hip fracture with open reduction and internal fixation     August 19, 2003.  3. Severe asthma.  4. Hypertension.  5. History of congestive heart failure.   At the present time, the  patient is doing well after his recent amputation  and then subsequent hip fracture.  He is allowed weightbearing as tolerated  on the left lower extremity and has a new prosthesis on his right lower  extremity.  He has started in outpatient therapies at the present time with  Robin __________ and will likely make slow but gradual progress,  substantially limited by his pulmonary condition.   At this point, the patient will be using his prosthesis two hours a day and  will attend outpatient physical therapy with Robin.  Will plan on seeing the  patient in follow-up in this office eon an as needed basis.  He will  be  following up with his orthopedist, Dr. Thurston Hole, over the next week or so.      Ellwood Dense, M.D.   DC/MedQ  D:  12/08/2003 15:57:15  T:  12/08/2003 17:13:29  Job #:  [id number]

## 2011-03-09 NOTE — H&P (Signed)
[place]. [facility]  Patient:    AHMARION, SARACENO Visit Number: [id number] MRN: [id number]          Service Type: MED Location: 3000 [id number] Attending Physician:  Lesly Dukes Dictated by:   Marlan Palau, M.D. Admit Date:  02/20/2002   CC:         Tammy R. Collins Scotland, M.D.   History and Physical  HISTORY OF PRESENT ILLNESS:  Buell L. Jurgens is a 77 year old right-handed white male -- born Jun 21, 1921 -- with a history of hypertension and tobacco abuse.  This patient comes into [facility] Room at this point noticing some difficulty with ambulation upon awakening this morning.  The patient denied any headache, may have had some slight right-sided numbness, had some slurred speech.  A lot of his symptoms have improved but his ability to ambulate has still not normalized.  A CT scan of the brain has been done and shows some minimal small vessel ischemic changes but no evidence of a cerebellar stroke event.  No acute changes were seen. The patient is noted to have a significant left-sided hemiataxia syndrome on clinical examination.  The patient is being admitted for further evaluation of possible left cerebellar stroke.  PAST MEDICAL HISTORY: 1. History of hypertension. 2. History of prior cerebrovascular disease with right-sided symptoms in 1991. 3. History of cataract surgery. 4. History of gait disorder -- left-sided hemiataxia.  CURRENT MEDICATIONS: 1. Procardia XL 30 mg one a day. 2. Aspirin one a day.  HABITS:  The patient smokes a half a pack of cigarettes a day, does not drink alcohol.  ALLERGIES:  The patient has no known allergies.  SOCIAL HISTORY:  The patient is retired and lives in [address] area, retired from [employer].  FAMILY MEDICAL HISTORY:  Family medical history is notable that mother died from suicide and father died at an elderly age.  REVIEW OF SYSTEMS:  Review of systems notable  for no headache.  The patient has had some chills recently, last time two weeks ago.  The patient denies any neck pain, shortness of breath, chest pains.  He has been told that he had some irregular heat beat at times.  The patient denies any abdominal pain, nausea or vomiting, denies any significant problems with bowels or bladder. The patient is not clear if he has any new weakness, per se.  The patient denies any double vision.  No blackout episodes have been noted.  PHYSICAL EXAMINATION:  VITALS:  Blood pressure is 174/89.  Heart rate 70.  Respiratory rate 16. Temperature:  Afebrile.  GENERAL:  This patient is a fairly well-developed, elderly white male who is alert and cooperative at the time of examination.  HEENT:  Head is atraumatic.  Eyes:  Pupils are slightly irregular, post surgical, react to light.  Disks are flat bilaterally.  Extraocular movements are full.  Speech is fairly well-enunciated, not aphasic.  NECK:  Supple.  No carotid bruits noted.  RESPIRATORY:  Clear.  CARDIOVASCULAR:  Examination reveals a regular rate and rhythm with no obvious murmurs or rubs noted.  ABDOMEN:  Positive bowel sounds.  No organomegaly or tenderness noted.  EXTREMITIES:  Without significant edema.  NEUROLOGIC:  Cranial nerves as above.  Facial symmetry is present.  The patient has good sensation in the face to pinprick and soft touch bilaterally. The patient has good strength of facial muscles and the muscles with head-turning and shoulder shrugs bilaterally; the  patient has good strength again to head-turning and shoulder shrugs.  The patient has a decreased gag reflex.  Tongue is midline with protrusion.  Minimal, if any, dysarthria is noted.  The patient has good strength to all fours.  Good and symmetric motor tone is noted throughout.  Sensory testing to my exam shows good symmetry to pinprick sensation throughout but with a questionable decrease on the right leg as compared  to the left, with pinprick and vibratory sensation again slightly depressed on the right leg as compared to the left; more symmetric in the upper extremities.  The patient has very obvious left-sided hemiataxia with finger-to-nose-to-finger and toe-to-finger on the left side, not present on the right.  The patient has no drift.  Deep tendon reflexes are relatively symmetric.  Toes are neutral bilaterally.  LABORATORY AND ACCESSORY DATA:  Laboratory values are notable for a white count of 7.4, hemoglobin of 17.0, hematocrit of 49.2, MCV of 92.8, platelets of 177, MCV of 92.8.  Coagulations are unremarkable.  Electrolyte panel is pending at this time.  EKG reveals a normal sinus rhythm with occasional premature supraventricular complexes, rightward axis deviation is noted, anteroseptal infarct, age undetermined, heart rate of 80.  CT scan of the head is as above.  IMPRESSION: 1. New onset of left-sided hemiataxia, rule out Wallenberg-type stroke event    on the left. 2. Hypertension. 3. Tobacco abuse.  This patient does have calcification of the vertebral arteries by CT scan.  No acute events noted by CT.  The patient will be admitted for further evaluation.  The patient has a significant left-sided hemiataxia syndrome.  PLAN: 1. Admission to [facility]. 2. IV heparin for now. 3. MRI of the brain. 4. MR angiogram. 5. Two-dimensional echocardiogram. 6. Carotid Doppler studies. 7. Physical, occupational and speech therapy evaluations. 8. I will follow the patients clinical course while in house. Dictated by:   Marlan Palau, M.D. Attending Physician:  Lesly Dukes DD:  02/20/02 TD:  02/21/02 Job: [id number]

## 2011-03-09 NOTE — Discharge Summary (Signed)
[place]. [facility]  Patient:    Ronald, [name] Visit Number: [id number] MRN: [id number]          Service Type: MED Location: 3000 [id number] Attending Physician:  Lesly Dukes Dictated by:   Marlan Palau, M.D. Admit Date:  02/20/2002 Disc. Date: 02/24/02   CC:         Tammy R. Collins Scotland, M.D.   Discharge Summary  ADMISSION DIAGNOSES: 1. New onset of left-sided hemiataxia, rule out cerebellar stroke. 2. Hypertension. 3. Tobacco abuse.  DISCHARGE DIAGNOSES: 1. 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 2. Hypertension.  PROCEDURES: 1. CT of the head. 2. MRI of the brain. 3. MRI angiogram. 4. Carotid Doppler study. 5. 2 CD echocardiogram.  COMPLICATIONS OF ABOVE PROCEDURES:  None.  HISTORY OF PRESENT ILLNESS:  Ronald [name] is a 77 year old right-handed white male born 01-Jun-1921, with a history of hypertension and tobacco abuse.  This patient was seen through the [facility] Room with difficulty ambulating upon awakening.  Patient had not complained of any headache, just had some slight right arm numbness, slurred speech.  Patient did note some dysarthria that improved.  The patient was brought to the emergency room for evaluation and found to have a left hemiataxia, was admitted for further evaluation.  PAST MEDICAL HISTORY: 1. Hypertension. 2. History of prior cerebrovascular disease with right-sided symptoms in    1991. 3. History of cataract surgery. 4. History of gait disorder with left-sided hemiataxia as above.  MEDICATIONS: 1. Procardia XL 30 mg one a day. 2. Aspirin one a day upon admission.  SOCIAL HISTORY:  The patient smokes a half-pack of cigarettes a day, does not drink alcohol.  ALLERGIES:  No known allergies.  Please refer to the history and physical dictation summary for social history, family history, review of systems, and  physical examination.  LABORATORY DATA:  Notable for a white count of 7.7, hemoglobin of 17.8, hematocrit 51.9, MCV of 93.4, platelets of 176.  INR 1.0.  EKG reveals normal sinus rhythm with premature supraventricular complexes, heart rate of 80, rightward axis deviation, anterior septal infarct age undetermined.  HOSPITAL COURSE:  The patient was admitted to [facility] for further evaluation.  The patient did undergo an MRI scan of the brain that confirmed evidence of an acute lacunar infarction of the posterior limb of the right internal capsule.  No arterial occlusions were seen intracranially.  The patient initially was treated with heparin.  An MRI scan of his neck vessels shows a mild irregularity in the internal carotid arteries bilaterally likely related to atherosclerotic disease.  No hemodynamically significant stenosis was seen.  Suggestion of a moderate stenosis at the origin of the left vertebral artery was noted.  This patient also was subjected to a 2 D echocardiogram, which has been performed.  The echocardiogram shows no significant abnormalities.  The patient was felt to have an ejection fraction of 55-65%.  Left ventricular systolic function was normal.  No evidence of left ventricular regional wall motion abnormalities was seen.  Left ventricular wall thickness was mildly increased.  There was mild right ventricular hypertrophy.  No cardiac source of emboli was seen.  A carotid Doppler study was also performed, and this showed no internal carotid artery stenosis, antegrade vertebral artery flow bilaterally was seen.  The patient was seen by physical, occupational, and speech therapy.  The patient was taken off  of heparin after 24 hours of admission.  The patient worsened clinically within the next 24 hours, developed left hemiparesis where the patient mainly had a left hemiataxia syndrome previously.  The hemiparesis involved the arm a little bit more than  the leg.  Repeat MRI scan of the brain showed no enlargement of the stroke event.  The patient was put back on heparin, however, and was treated for another 24 hours.  The patient is now off of heparin, back on aspirin and Plavix, and is to go to rehab at this point.  The patient was seen by speech therapy, and a swallowing evaluation was done.  At the current time, the patient is bright, alert, cooperative, minimal dysarthria, 4-/5 strength in the left arm, 4/5 strength in the left leg.  The patient has been ambulating with a walker/quad cane.  Visual fields are full. No aphasia is noted.  DISCHARGE MEDICATIONS: 1. Aspirin one tablet daily. 2. Plavix 75 mg a day. 3. Procardia 30 mg XL tablet daily.  DISCHARGE INSTRUCTIONS:  The patient is on a mechanical soft diet with thin liquids with aspiration precautions.  FOLLOW-UP:  The patient will be followed on the rehab service. Dictated by:   Marlan Palau, M.D. Attending Physician:  Lesly Dukes DD:  02/24/02 TD:  02/24/02 Job: [id number]

## 2011-03-09 NOTE — Assessment & Plan Note (Signed)
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   Mr. Frankie had a followup ERCP with Dr. Christella Hartigan.  His stent was removed  and he had good flow and an adequate sphincterotomy from my previous  procedure.  There was no evidence of tumor seen at the ERCP.  Dr. Christella Hartigan  felt like an endoscopic ultrasound was not necessary.  He did take some  brushings which showed some atypical cells but no cancer.  The patient  has been improving all along with good appetite.  He has LFTs today that  are normal save for an albumin of 2.6.  He has been gaining weight.  Medications are listed and reviewed in the chart.   PHYSICAL EXAM:  He is anicteric.  Weight 152 pounds, pulse 76, blood  pressure 128/60.   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  The etiology of  this lesion is not entirely clear.  It looked to me like he had perhaps  an adenoma.  However, after the sphincterotomy it is not so evident.  At  any rate, he is elderly and there is no good evidence of cancer on the  ERCP or the CT scan for sure and we would plan to monitor things.  He  has had successful therapy and relief of his biliary obstruction.  The  patient and his family are in complete agreement with this conservative  approach at this time.  They understand that a cancer could be present  that could be present that could grow and create further problems.   The current plan is for him to have a hepatic function panel checked in  about 3 months at Dr. Lucie Leather office.  I gave him a prescription to  do so.  Certainly if he develops signs of jaundice I want them to call  me back.  Otherwise, we will be available as needed.   I appreciate the  opportunity to care for this patient.     Iva Boop, MD,FACG  Electronically Signed    CEG/MedQ  DD: 12/31/2006  DT: 01/02/2007  Job #: [id number]   cc:   Evelena Peat, M.D.

## 2011-03-09 NOTE — Discharge Summary (Signed)
[facility]  Patient:    Ronald [name], Ronald [name] Visit Number: [id number] MRN: [id number]          Service Type: [facility] Location: [id number] Attending Physician:  Faith Rogue T Dictated by:   Mcarthur Rossetti. Angiulli, P.A. Admit Date:  02/24/2002 Discharge Date: 03/13/2002   CC:         Marlan Palau, M.D.  Kristian Covey, M.D.   Discharge Summary  DISCHARGE DIAGNOSES: 1. 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 2. Dysarthria, secondary to cerebral vascular accident. 3. History of left cerebral vascular accident in 1991. 4. Hypertension. 5. History of right elbow trauma.  HISTORY OF PRESENT ILLNESS: This 77 year old white male with history of left cerebral vascular accident in 1991 of which he was maintained on aspirin therapy. Admitted Feb 20, 2002 with difficult gait and slurred speech. No chest pain or shortness of breath. Upon evaluation, cranial CT scan was negative. Carotid duplex was negative. MRI with acute lacunar infarction of the posterior limb of the right internal capsule. MRA revealed no occlusion. Modified barium swallow on Feb 24, 2002. Maintained on mechanical soft diet. He was seen in consult by Neurology Services, Dr. Lesia Sago, and placed on IV Heparin, aspirin, and Plavix therapy. He was total assist for ambulation due to poor balance. Minimal assist for ambulation. Latest chemistries unremarkable. He was admitted for a comprehensive rehab program.  PAST MEDICAL HISTORY: See discharge diagnoses.  PAST SURGICAL HISTORY: Cataract surgery.  ALLERGIES: None.  SOCIAL HISTORY: Noted history of tobacco use. Denied alcohol.  ADMISSION MEDICATIONS: 1. Aspirin 81 mg daily. 2. Procardia XL 30 mg daily.  PRIMARY CARE PHYSICIAN: Evelena Peat, M.D.  FAMILY HISTORY: Lives with his wife in [place], [place]. Independent prior to admission. One level home with one step to entry. Wife to assist on  discharge.  HOSPITAL COURSE: The patient did well while on rehab services with therapies initiated on a b.i.d. basis. The following issues were followed during patients rehab course. Pertaining to Mr. Hartlage right internal capsule infarction, remained stable. Left sided weakness continued to improve, as he was grossly graded at 3+ to 4- out of 5. He continued on combination of aspirin and Plavix therapy. His dysarthria had greatly improved, as he was intelligible with his speech. His diet had been advanced to mechanical soft and tolerated well. He had no bowel or bladder disturbances. He exhibited no unsafe behavior. Noted history of hypertension, as he continued on Procardia XL 30 mg daily with no headache or dizziness. He had a history of right elbow trauma and lacked full extension at the elbow. Overall, for his functional mobility, he was close supervision in all areas of mobility, physical therapy, occupational therapy, and activities of daily living. Outpatient therapy had been arranged. Full family teaching was completed. All day passes went well.  LABORATORY DATA: H&H 17.7 and 50.2. Platelet count 162,000. Sodium 144, potassium 3.9, BUN 20, creatinine 1.0.  DISCHARGE MEDICATIONS: 1. Ecotrin 325 mg daily. 2. Plavix 75 mg daily. 3. Procardia XL 30 mg daily. 4. Tylenol as needed.  ACTIVITY: As tolerated.  DIET: Mechanical soft.  SPECIAL INSTRUCTIONS: Outpatient therapies as arranged per outpatient rehab services. The patient is to see Dr. Faith Rogue at the Outpatient Rehabilitation Service office in follow-up. He should receive medical follow-up per Dr. Caryl Never. Dictated by:   Mcarthur Rossetti. Angiulli, P.A. Attending Physician:  Faith Rogue T DD:  03/12/02 TD:  03/15/02 Job: [id number]

## 2011-03-09 NOTE — Discharge Summary (Signed)
NAMEMUHAMMADALI, RIES NO.:  [id number]   MEDICAL RECORD NO.:  [id number]          PATIENT TYPE:  INP   LOCATION:  5501                         FACILITY:  [facility]   PHYSICIAN:  Ollen Gross. Vernell Morgans, M.D. DATE OF BIRTH:  1921-04-13   DATE OF ADMISSION:  03/06/2006  DATE OF DISCHARGE:  03/12/2006                                 DISCHARGE SUMMARY   DATE OF ADMISSION:  Mar 06, 2006   DATE OF DISCHARGE:  Mar 12, 2006   Mr. Okerlund is an 77 year old gentleman who was brought in with the diagnosis  of gallstones.  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  Attempts were made at a laparoscopic resection, but they were unsuccessful.  He was then opened and had an open cholecystectomy and the cholecystoenteric  fistula was repaired.  He tolerated this well.  A drain was left in place,  which was removed on the day of discharge.  An NG tube was left in also  postoperatively.  His NG tube was discontinued on the second postoperative  day and he was started on clears.  He had some problems with hypokalemia,  but this was corrected.  Once he was able to tolerate p.o., he was restarted  on his blood pressure medications for hypertension, but his blood pressure  remained in a pretty good range during his postoperative course.  He  gradually improved, until on Mar 12, 2006, he was tolerating his diet,  ambulating, and was ready for discharge home.   MEDICATIONS AT TIME OF DISCHARGE:  He was to resume his home medications and  he was given a prescription for Vicodin for pain.   ACTIVITIES:  No heavy lifting.   DIET:  As tolerated.   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   Followup will be with Dr. Carolynne Edouard in the next week and he is discharged home.      Ollen Gross. Vernell Morgans, M.D.  Electronically Signed     PST/MEDQ  D:  05/01/2006  T:   05/01/2006  Job:  [id number]

## 2011-04-24 ENCOUNTER — Encounter: Payer: Self-pay | Admitting: Family Medicine

## 2011-04-27 ENCOUNTER — Ambulatory Visit (INDEPENDENT_AMBULATORY_CARE_PROVIDER_SITE_OTHER): Payer: Self-pay | Admitting: Family Medicine

## 2011-04-27 ENCOUNTER — Encounter: Payer: Self-pay | Admitting: Family Medicine

## 2011-04-27 VITALS — BP 138/80 | Temp 98.1°F | Wt 164.0 lb

## 2011-04-27 DIAGNOSIS — I10 Essential (primary) hypertension: Secondary | ICD-10-CM

## 2011-04-27 LAB — BASIC METABOLIC PANEL
BUN: 17 mg/dL (ref 6–23)
CO2: 32 mEq/L (ref 19–32)
Chloride: 105 mEq/L (ref 96–112)
Creatinine, Ser: 1.2 mg/dL (ref 0.4–1.5)
Potassium: 3.8 mEq/L (ref 3.5–5.1)
Sodium: 145 mEq/L (ref 135–145)

## 2011-04-27 MED ORDER — CLOPIDOGREL BISULFATE 75 MG PO TABS: 75.0000 mg | ORAL_TABLET | Freq: Every day | ORAL | Status: DC

## 2011-04-27 NOTE — Progress Notes (Signed)
  Subjective:    Patient ID: Ronald [name], male    DOB: [birth year], 77 y.o.   MRN: [id number]  HPI Patient seen for followup. History of hypertension, probably very mild dementia and remote history of CVA. He is doing well. No recent falls. Compliant with medications. Remains on Plavix and lisinopril HCTZ. Denies recent chest pain. He had good appetite weight has been stable.   Review of Systems  Constitutional: Negative for fever, chills and appetite change.  Respiratory: Negative for cough and shortness of breath.   Cardiovascular: Negative for chest pain, palpitations and leg swelling.  Neurological: Negative for headaches.       Objective:   Physical Exam  Constitutional: He appears well-developed and well-nourished.  Cardiovascular: Normal rate, regular rhythm and normal heart sounds.   Pulmonary/Chest: Effort normal and breath sounds normal. No respiratory distress. He has no wheezes. He has no rales.  Musculoskeletal: He exhibits no edema.          Assessment & Plan:  #1 hypertension stable. Continue current medication. Check basic metabolic panel #2 history of cerebrovascular accident. Refill Plavix for one year. Lipids have been stable for many years and at goal. We elected not to recheck today

## 2011-05-01 NOTE — Progress Notes (Signed)
Quick Note:  Pt wife informed, (pt hard of hearing_ ______

## 2011-10-29 ENCOUNTER — Encounter: Payer: Self-pay | Admitting: Family Medicine

## 2011-10-29 ENCOUNTER — Ambulatory Visit (INDEPENDENT_AMBULATORY_CARE_PROVIDER_SITE_OTHER): Payer: No Typology Code available for payment source | Admitting: Family Medicine

## 2011-10-29 VITALS — BP 160/80 | Temp 98.2°F | Wt 162.0 lb

## 2011-10-29 DIAGNOSIS — Z8679 Personal history of other diseases of the circulatory system: Secondary | ICD-10-CM

## 2011-10-29 DIAGNOSIS — Z23 Encounter for immunization: Secondary | ICD-10-CM

## 2011-10-29 DIAGNOSIS — I10 Essential (primary) hypertension: Secondary | ICD-10-CM

## 2011-10-29 MED ORDER — LISINOPRIL-HYDROCHLOROTHIAZIDE 20-12.5 MG PO TABS: 1.0000 | ORAL_TABLET | Freq: Every day | ORAL | Status: DC

## 2011-10-29 NOTE — Progress Notes (Signed)
  Subjective:    Patient ID: Ronald [name], male    DOB: 28-Jan-1921, 78 y.o.   MRN: [id number]  HPI  Routine medical followup. History of hypertension and remote history of stroke. He unfortunately still smokes but only a couple cigarettes per day. Remains on Plavix and lisinopril HCTZ.  Denies chest pains or dizziness. No recent falls. Requesting flu vaccine. Pneumovax is up-to-date. Tetanus up-to-date.  Compliant with medications.   Good appetite and generally feels well. No problems with dysuria. No change in stool habits. Denies any focal weakness.  He is not interested in quitting smoking at this time.    Past Medical History  Diagnosis Date  . Hypertension   . Stroke   . Kidney stone    Past Surgical History  Procedure Date  . Cholecystectomy     reports that he has been smoking.  He does not have any smokeless tobacco history on file. He reports that he does not drink alcohol. His drug history not on file. family history is not on file. No Known Allergies   Review of Systems  Constitutional: Negative for fatigue.  HENT: Negative for trouble swallowing.   Eyes: Negative for visual disturbance.  Respiratory: Negative for cough, chest tightness and shortness of breath.   Cardiovascular: Negative for chest pain, palpitations and leg swelling.  Gastrointestinal: Negative for nausea, vomiting and abdominal pain.  Genitourinary: Negative for dysuria.  Neurological: Negative for dizziness, syncope, weakness, light-headedness and headaches.  Psychiatric/Behavioral: Negative for dysphoric mood.       Objective:   Physical Exam  Constitutional: He appears well-developed and well-nourished.  Neck: Neck supple.  Cardiovascular: Normal rate and regular rhythm.   Pulmonary/Chest: Effort normal and breath sounds normal. No respiratory distress. He has no wheezes. He has no rales.  Abdominal: Soft. There is no tenderness.  Musculoskeletal: He exhibits no edema.  Neurological: He is  alert. No cranial nerve deficit.  Psychiatric: He has a normal mood and affect.          Assessment & Plan:  #1Hypertension stable. With repeat reading standing 128/70. Refill medication for one year. #2 health maintenance. Flu vaccine given.  #3 history of cerebrovascular accident. Check lipids at followup in 6 months along with BMP

## 2011-11-07 ENCOUNTER — Other Ambulatory Visit: Payer: Self-pay | Admitting: Family Medicine

## 2011-11-07 MED ORDER — LISINOPRIL-HYDROCHLOROTHIAZIDE 20-12.5 MG PO TABS: 1.0000 | ORAL_TABLET | Freq: Every day | ORAL | Status: DC

## 2011-11-07 NOTE — Telephone Encounter (Signed)
Please call lisinopril-hctz #[phone] with 3 refills into [facility] [phone]. Pt was seen on 10-29-2011. Son had med call into wrong pharm

## 2012-01-16 ENCOUNTER — Telehealth: Payer: Self-pay | Admitting: Family Medicine

## 2012-01-16 NOTE — Telephone Encounter (Signed)
Patient's son Ronald [name] called stating that patient will be having dental surgery and the Dentist would like to know how many days ahead of surgery should patient stop his plavix. Son is in town awaiting MD response before leaving. Please call today if possible.

## 2012-01-16 NOTE — Telephone Encounter (Signed)
Pt son informed.  He asked we fax a note to Dr Melvyn Neth at [phone]

## 2012-01-16 NOTE — Telephone Encounter (Signed)
Hold for 3 days prior to surgery and start back as soon as possible following surgery.

## 2012-04-28 ENCOUNTER — Ambulatory Visit: Payer: No Typology Code available for payment source | Admitting: Family Medicine

## 2012-05-05 ENCOUNTER — Ambulatory Visit (INDEPENDENT_AMBULATORY_CARE_PROVIDER_SITE_OTHER): Payer: Medicare Other | Admitting: Family Medicine

## 2012-05-05 ENCOUNTER — Encounter: Payer: Self-pay | Admitting: Family Medicine

## 2012-05-05 VITALS — BP 140/62 | Temp 98.0°F | Wt 162.0 lb

## 2012-05-05 DIAGNOSIS — Z8679 Personal history of other diseases of the circulatory system: Secondary | ICD-10-CM

## 2012-05-05 DIAGNOSIS — I10 Essential (primary) hypertension: Secondary | ICD-10-CM

## 2012-05-05 LAB — BASIC METABOLIC PANEL
BUN: 25 mg/dL — ABNORMAL HIGH (ref 6–23)
CO2: 31 mEq/L (ref 19–32)
Calcium: 9.3 mg/dL (ref 8.4–10.5)
Creatinine, Ser: 1.3 mg/dL (ref 0.4–1.5)
GFR: 56.69 mL/min — ABNORMAL LOW (ref >60.00)

## 2012-05-05 MED ORDER — CLOPIDOGREL BISULFATE 75 MG PO TABS: 75.0000 mg | ORAL_TABLET | Freq: Every day | ORAL | Status: DC

## 2012-05-05 NOTE — Progress Notes (Signed)
  Subjective:    Patient ID: Ronald [name], male    DOB: October 22, 1921, 78 y.o.   MRN: [id number]  HPI  Medical followup. Patient history of hypertension, remote history of cerebrovascular disease and probably some early dementia. He has advanced macular degeneration. Legally blind. No recent falls. Compliant with blood pressure medication. Also remains on Plavix. Requesting refill Plavix today. No recent chest pains. No focal weakness. No headaches. Lipids have been fairly well controlled the past without medication. Last LDL 76 and patient reluctant to take statin medication. She will turn 90 this year. Lives with wife who also will turn 62 in a couple months.  Past Medical History  Diagnosis Date  . Hypertension   . Stroke   . Kidney stone    Past Surgical History  Procedure Date  . Cholecystectomy     reports that he has been smoking.  He does not have any smokeless tobacco history on file. He reports that he does not drink alcohol. His drug history not on file. family history is not on file. No Known Allergies    Review of Systems  Constitutional: Negative for appetite change, fatigue and unexpected weight change.  Eyes: Negative for visual disturbance.  Respiratory: Negative for cough, chest tightness and shortness of breath.   Cardiovascular: Negative for chest pain, palpitations and leg swelling.  Genitourinary: Negative for dysuria.  Neurological: Negative for dizziness, syncope, weakness, light-headedness and headaches.       Objective:   Physical Exam  Constitutional: He appears well-developed and well-nourished.  Neck: Neck supple. No thyromegaly present.  Cardiovascular: Normal rate and regular rhythm.   Pulmonary/Chest: Effort normal and breath sounds normal. No respiratory distress. He has no wheezes. He has no rales.  Musculoskeletal: He exhibits no edema.  Neurological: He is alert.          Assessment & Plan:  #1 hypertension. Stable. Continue current  medication. Check basic metabolic panel #2 remote history of cerebrovascular disease. Continue Plavix. Lipids have been controlled in the past.

## 2012-11-03 ENCOUNTER — Encounter: Payer: Self-pay | Admitting: Family Medicine

## 2012-11-03 ENCOUNTER — Ambulatory Visit (INDEPENDENT_AMBULATORY_CARE_PROVIDER_SITE_OTHER): Payer: Medicare Other | Admitting: Family Medicine

## 2012-11-03 VITALS — BP 140/80 | Temp 97.5°F | Wt 160.0 lb

## 2012-11-03 DIAGNOSIS — H3530 Unspecified macular degeneration: Secondary | ICD-10-CM | POA: Insufficient documentation

## 2012-11-03 DIAGNOSIS — I10 Essential (primary) hypertension: Secondary | ICD-10-CM

## 2012-11-03 DIAGNOSIS — Z23 Encounter for immunization: Secondary | ICD-10-CM

## 2012-11-03 MED ORDER — LISINOPRIL-HYDROCHLOROTHIAZIDE 20-12.5 MG PO TABS: 1.0000 | ORAL_TABLET | Freq: Every day | ORAL | Status: DC

## 2012-11-03 NOTE — Addendum Note (Signed)
Addended by: Melchor Amour on: 11/03/2012 09:22 AM   Modules accepted: Orders

## 2012-11-03 NOTE — Progress Notes (Signed)
  Subjective:    Patient ID: Ronald [name], male    DOB: [birth year], 79 y.o.   MRN: [id number]  HPI Medical followup. Patient has history of hypertension, macular degeneration, remote history of stroke. He just had his 79th birthday. Still smokes 3-4 cigarettes per day. Overall doing well. No falls. Mood is stable. Appetite fair. No specific complaints. No dizziness. No chest pains. Compliant with therapy. Needs refills lisinopril HCTZ.  Past Medical History  Diagnosis Date  . Hypertension   . Stroke   . Kidney stone    Past Surgical History  Procedure Date  . Cholecystectomy     reports that he has been smoking.  He does not have any smokeless tobacco history on file. He reports that he does not drink alcohol. His drug history not on file. family history is not on file. No Known Allergies    Review of Systems  Constitutional: Negative for fatigue.  Eyes: Negative for visual disturbance.  Respiratory: Negative for cough, chest tightness and shortness of breath.   Cardiovascular: Negative for chest pain, palpitations and leg swelling.  Neurological: Negative for dizziness, syncope, weakness, light-headedness and headaches.       Objective:   Physical Exam  Constitutional: He appears well-developed and well-nourished.  Cardiovascular: Normal rate and regular rhythm.   Pulmonary/Chest: Effort normal and breath sounds normal. No respiratory distress. He has no wheezes. He has no rales.  Musculoskeletal: He exhibits no edema.          Assessment & Plan:  #1 hypertension. Stable. Refill lisinopril HCTZ for one year #2 history of CVA. Discussed with patient and son issues such as assessing lipids and they have elected against this.  #3 health maintenance. No flu vaccine yet. Flu vaccine given

## 2013-05-04 ENCOUNTER — Encounter: Payer: Self-pay | Admitting: Family Medicine

## 2013-05-04 ENCOUNTER — Ambulatory Visit (INDEPENDENT_AMBULATORY_CARE_PROVIDER_SITE_OTHER): Payer: Medicare Other | Admitting: Family Medicine

## 2013-05-04 VITALS — BP 132/68 | HR 65 | Temp 97.9°F | Wt 157.0 lb

## 2013-05-04 DIAGNOSIS — Z8679 Personal history of other diseases of the circulatory system: Secondary | ICD-10-CM

## 2013-05-04 DIAGNOSIS — F17200 Nicotine dependence, unspecified, uncomplicated: Secondary | ICD-10-CM | POA: Insufficient documentation

## 2013-05-04 DIAGNOSIS — I10 Essential (primary) hypertension: Secondary | ICD-10-CM

## 2013-05-04 LAB — BASIC METABOLIC PANEL
CO2: 31 mEq/L (ref 19–32)
Calcium: 8.9 mg/dL (ref 8.4–10.5)
GFR: 60.38 mL/min (ref >60.00)
Glucose, Bld: 100 mg/dL — ABNORMAL HIGH (ref 70–99)

## 2013-05-04 MED ORDER — CLOPIDOGREL BISULFATE 75 MG PO TABS: 75.0000 mg | ORAL_TABLET | Freq: Every day | ORAL | Status: DC

## 2013-05-04 NOTE — Progress Notes (Signed)
  Subjective:    Patient ID: Ronald [name], male    DOB: November 28, 1920, 79 y.o.   MRN: [id number]  HPI Medical follow up Patient has history of hypertension, macular degeneration and remote history of CVA. He continues to smoke about 3-4 cigarettes per day. He has done remarkably well over recent years. No complaints. He remains on Plavix and lisinopril HCTZ. Blood pressures been well controlled. He denies any dizziness. No chest pains. No recent falls. Ambulates with cane.  Pneumovax up-to-date. He is encouraged to continue yearly flu vaccines. He is declined shingles vaccine.  Past Medical History  Diagnosis Date  . Hypertension   . Stroke   . Kidney stone    Past Surgical History  Procedure Laterality Date  . Cholecystectomy      reports that he has been smoking.  He does not have any smokeless tobacco history on file. He reports that he does not drink alcohol. His drug history is not on file. family history is not on file. No Known Allergies   Review of Systems  Constitutional: Negative for appetite change, fatigue and unexpected weight change.  Eyes: Positive for visual disturbance.  Respiratory: Negative for cough, chest tightness and shortness of breath.   Cardiovascular: Negative for chest pain, palpitations and leg swelling.  Endocrine: Negative for polydipsia and polyuria.  Neurological: Negative for dizziness, syncope, weakness, light-headedness and headaches.       Objective:   Physical Exam  Constitutional: He is oriented to person, place, and time. He appears well-developed and well-nourished.  Neck: Neck supple. No thyromegaly present.  Cardiovascular:  Occasional skipped beat. Otherwise regular. Normal rate of around 64-68  Pulmonary/Chest:  Slightly diminished breath sounds throughout but clear  Musculoskeletal: He exhibits no edema.  Neurological: He is alert and oriented to person, place, and time.          Assessment & Plan:  Hypertension.  Adequately controlled. Recheck basic metabolic panel  History of CVA. Continue Plavix.  Ongoing nicotine use. We have discussed many times over the years quitting but he has no interest.

## 2013-11-02 ENCOUNTER — Ambulatory Visit (INDEPENDENT_AMBULATORY_CARE_PROVIDER_SITE_OTHER): Payer: Medicare Other | Admitting: Family Medicine

## 2013-11-02 ENCOUNTER — Other Ambulatory Visit: Payer: Self-pay

## 2013-11-02 ENCOUNTER — Encounter: Payer: Self-pay | Admitting: Family Medicine

## 2013-11-02 VITALS — BP 134/80 | HR 81 | Temp 97.6°F | Wt 156.0 lb

## 2013-11-02 DIAGNOSIS — Z23 Encounter for immunization: Secondary | ICD-10-CM

## 2013-11-02 DIAGNOSIS — Z8679 Personal history of other diseases of the circulatory system: Secondary | ICD-10-CM

## 2013-11-02 DIAGNOSIS — F17200 Nicotine dependence, unspecified, uncomplicated: Secondary | ICD-10-CM

## 2013-11-02 DIAGNOSIS — I10 Essential (primary) hypertension: Secondary | ICD-10-CM

## 2013-11-02 MED ORDER — CLOPIDOGREL BISULFATE 75 MG PO TABS: 75.0000 mg | ORAL_TABLET | Freq: Every day | ORAL | Status: DC

## 2013-11-02 MED ORDER — LISINOPRIL-HYDROCHLOROTHIAZIDE 20-12.5 MG PO TABS: 1.0000 | ORAL_TABLET | Freq: Every day | ORAL | Status: DC

## 2013-11-02 NOTE — Progress Notes (Signed)
   Subjective:    Patient ID: Ronald [name], male    DOB: 08/22/1921, 80 y.o.   MRN: [id number]  HPI Patient here for medical follow up He does have his 89st birthday. He still smokes less than one half pack cigarettes per day. He has done remarkably well for his age and ongoing nicotine use. Had 2 previous strokes with this was several years ago. Takes Plavix regularly along with lisinopril HCTZ for hypertension. He denies any recent chest pains. No dizziness. No focal weakness. He has not yet had flu vaccine.  Denies any recent falls. No depression issues.  He is previously had good lipids though we've not checked in quite some time. He's not interested in pursuing further medication with statins even if lipids are elevated per discussion today.  He has macular degeneration which is been progressive. He does not drive. He is coming by son. She's not any recent falls.  Past Medical History  Diagnosis Date  . Hypertension   . Stroke   . Kidney stone    Past Surgical History  Procedure Laterality Date  . Cholecystectomy      reports that he has been smoking.  He does not have any smokeless tobacco history on file. He reports that he does not drink alcohol. His drug history is not on file. family history is not on file. No Known Allergies    Review of Systems  Constitutional: Negative for fatigue and unexpected weight change.  Respiratory: Negative for cough and chest tightness.   Cardiovascular: Negative for chest pain, palpitations and leg swelling.  Endocrine: Negative for polydipsia and polyuria.  Neurological: Negative for dizziness, syncope, weakness, light-headedness and headaches.       Objective:   Physical Exam  Constitutional: He appears well-developed and well-nourished.  Neck: Neck supple.  Cardiovascular: Normal rate.   Pulmonary/Chest: Effort normal.  Somewhat diminished breath sounds throughout. No rales  Musculoskeletal: He exhibits no edema.  Neurological:  He is alert.  Psychiatric: He has a normal mood and affect. His behavior is normal.          Assessment & Plan:  #1 hypertension. Adequate control. Continue lisinopril HCTZ #2 history of cerebrovascular disease with 2 remote CVAs. We discussed continue Plavix. We discussed lipid issues and is not interested in further treatment and lipids have been favorable in the past. #3 health maintenance. Flu vaccine given. He has no contraindications and has taken these in the past. He had pneumonia vaccine 2 years ago.

## 2013-11-02 NOTE — Progress Notes (Signed)
Pre visit review using our clinic review tool, if applicable. No additional management support is needed unless otherwise documented below in the visit note. 

## 2013-11-02 NOTE — Progress Notes (Deleted)
   Subjective:    Patient ID: Ronald PhenesJames L [name], male    DOB: 12/27/1920, 80 y.o.   MRN: [id number]  HPI    Review of Systems     Objective:   Physical Exam        Assessment & Plan:

## 2013-11-20 ENCOUNTER — Telehealth: Payer: Self-pay | Admitting: Family Medicine

## 2013-11-20 NOTE — Telephone Encounter (Signed)
Relevant patient education mailed to patient.  

## 2013-11-24 ENCOUNTER — Telehealth: Payer: Self-pay | Admitting: Family Medicine

## 2013-11-24 NOTE — Telephone Encounter (Signed)
Relevant patient education mailed to patient.  

## 2014-04-06 ENCOUNTER — Encounter: Payer: Self-pay | Admitting: Internal Medicine

## 2014-04-06 ENCOUNTER — Ambulatory Visit (INDEPENDENT_AMBULATORY_CARE_PROVIDER_SITE_OTHER): Payer: Medicare Other | Admitting: Internal Medicine

## 2014-04-06 VITALS — BP 130/70 | HR 70 | Temp 98.3°F | Resp 18 | Ht 68.5 in | Wt 152.0 lb

## 2014-04-06 DIAGNOSIS — Z8679 Personal history of other diseases of the circulatory system: Secondary | ICD-10-CM

## 2014-04-06 DIAGNOSIS — I10 Essential (primary) hypertension: Secondary | ICD-10-CM

## 2014-04-06 DIAGNOSIS — R509 Fever, unspecified: Secondary | ICD-10-CM

## 2014-04-06 DIAGNOSIS — R071 Chest pain on breathing: Secondary | ICD-10-CM

## 2014-04-06 DIAGNOSIS — R252 Cramp and spasm: Secondary | ICD-10-CM

## 2014-04-06 DIAGNOSIS — E876 Hypokalemia: Secondary | ICD-10-CM

## 2014-04-06 DIAGNOSIS — R0789 Other chest pain: Secondary | ICD-10-CM

## 2014-04-06 DIAGNOSIS — F068 Other specified mental disorders due to known physiological condition: Secondary | ICD-10-CM

## 2014-04-06 DIAGNOSIS — H3530 Unspecified macular degeneration: Secondary | ICD-10-CM

## 2014-04-06 LAB — CBC WITH DIFFERENTIAL/PLATELET
Basophils Absolute: 0 10*3/uL (ref 0.0–0.1)
Basophils Relative: 0.1 % (ref 0.0–3.0)
EOS PCT: 0.4 % (ref 0.0–5.0)
Eosinophils Absolute: 0.1 10*3/uL (ref 0.0–0.7)
HEMATOCRIT: 46.8 % (ref 39.0–52.0)
HEMOGLOBIN: 15.7 g/dL (ref 13.0–17.0)
LYMPHS ABS: 1.1 10*3/uL (ref 0.7–4.0)
MCHC: 33.6 g/dL (ref 30.0–36.0)
MCV: 99.2 fl (ref 78.0–100.0)
MONOS PCT: 6.3 % (ref 3.0–12.0)
Monocytes Absolute: 1 10*3/uL (ref 0.1–1.0)
NEUTROS ABS: 14.3 10*3/uL — AB (ref 1.4–7.7)
Neutrophils Relative %: 86.8 % — ABNORMAL HIGH (ref 43.0–77.0)
Platelets: 126 10*3/uL — ABNORMAL LOW (ref 150.0–400.0)
RBC: 4.72 Mil/uL (ref 4.22–5.81)
RDW: 14.4 % (ref 11.5–15.5)
WBC: 16.5 10*3/uL — AB (ref 4.0–10.5)

## 2014-04-06 LAB — COMPREHENSIVE METABOLIC PANEL
ALT: 16 U/L (ref 0–53)
AST: 48 U/L — ABNORMAL HIGH (ref 0–37)
Albumin: 3.7 g/dL (ref 3.5–5.2)
Alkaline Phosphatase: 62 U/L (ref 39–117)
BILIRUBIN TOTAL: 2.8 mg/dL — AB (ref 0.2–1.2)
BUN: 37 mg/dL — ABNORMAL HIGH (ref 6–23)
CALCIUM: 9.3 mg/dL (ref 8.4–10.5)
CHLORIDE: 101 meq/L (ref 96–112)
CO2: 29 meq/L (ref 19–32)
CREATININE: 1.8 mg/dL — AB (ref 0.4–1.5)
GFR: 38.23 mL/min — AB (ref >60.00)
GLUCOSE: 98 mg/dL (ref 70–99)
Potassium: 3.8 mEq/L (ref 3.5–5.1)
Sodium: 137 mEq/L (ref 135–145)
Total Protein: 6.9 g/dL (ref 6.0–8.3)

## 2014-04-06 LAB — TSH: TSH: 2.58 u[IU]/mL (ref 0.35–4.50)

## 2014-04-06 NOTE — Progress Notes (Signed)
Pre-visit discussion using our clinic review tool. No additional management support is needed unless otherwise documented below in the visit note.  

## 2014-04-06 NOTE — Patient Instructions (Signed)
Take 650-100 0 mg of Tylenol every 6 hours as needed for pain relief or fever.  Avoid taking more than 3000 mg in a 24-hour period (  This may cause liver damage).  Call or return to clinic prn if these symptoms worsen or fail to improve as anticipated.

## 2014-04-06 NOTE — Progress Notes (Signed)
Subjective:    Patient ID: Ronald [name], male    DOB: 04/09/1921, 80 y.o.   MRN: [id number]  HPI  80 year old patient who is seen today after a fall, which occurred yesterday.  He has a history of cerebrovascular disease, dementia, macular degeneration, gait instability.  He uses both a cane and a walker.  For the past 2 days he's also had some fever that has been managed with Tylenol.  Intermittently he has become much more confused and incontinent. He has a history of cerebrovascular disease and prior stroke, which has caused left-sided weakness.  His fever has normalized with Tylenol and today his son feels that he is back to baseline.  Past Medical History  Diagnosis Date  . Hypertension   . Stroke   . Kidney stone     History   Social History  . Marital Status: Married    Spouse Name: N/A    Number of Children: N/A  . Years of Education: N/A   Occupational History  . Not on file.   Social History Main Topics  . Smoking status: Current Some Day Smoker  . Smokeless tobacco: Not on file  . Alcohol Use: No  . Drug Use: Not on file  . Sexual Activity: Not on file   Other Topics Concern  . Not on file   Social History Narrative  . No narrative on file    Past Surgical History  Procedure Laterality Date  . Cholecystectomy      No family history on file.  No Known Allergies  Current Outpatient Prescriptions on File Prior to Visit  Medication Sig Dispense Refill  . aspirin 81 MG tablet Take 81 mg by mouth every hour as needed.        . clopidogrel (PLAVIX) 75 MG tablet Take 1 tablet (75 mg total) by mouth daily.  90 tablet  3  . lisinopril-hydrochlorothiazide (PRINZIDE,ZESTORETIC) 20-12.5 MG per tablet Take 1 tablet by mouth daily.  90 tablet  3  . mometasone (ELOCON) 0.1 % lotion Apply topically daily as needed.        No current facility-administered medications on file prior to visit.    BP 130/70  Pulse 70  Temp(Src) 98.3 F (36.8 C) (Oral)  Resp 18   Ht 5' 8.5" (1.74 m)  Wt 152 lb (68.947 kg)  BMI 22.77 kg/m2  SpO2 97%       Review of Systems  Constitutional: Positive for fever, activity change, appetite change and fatigue. Negative for chills.  HENT: Negative for congestion, dental problem, ear pain, hearing loss, sore throat, tinnitus, trouble swallowing and voice change.   Eyes: Negative for pain, discharge and visual disturbance.  Respiratory: Negative for cough, chest tightness, wheezing and stridor.   Cardiovascular: Negative for chest pain, palpitations and leg swelling.  Gastrointestinal: Negative for nausea, vomiting, abdominal pain, diarrhea, constipation, blood in stool and abdominal distention.  Genitourinary: Negative for urgency, hematuria, flank pain, discharge, difficulty urinating and genital sores.  Musculoskeletal: Positive for gait problem. Negative for arthralgias, back pain, joint swelling, myalgias and neck stiffness.  Skin: Negative for rash.  Neurological: Positive for weakness. Negative for dizziness, syncope, speech difficulty, numbness and headaches.  Hematological: Negative for adenopathy. Does not bruise/bleed easily.  Psychiatric/Behavioral: Positive for behavioral problems and confusion. Negative for dysphoric mood. The patient is not nervous/anxious.        Objective:   Physical Exam  Constitutional: He is oriented to person, place, and time. He appears well-developed.  Afebrile Tachycardia O2 saturation 97   HENT:  Head: Normocephalic.  Right Ear: External ear normal.  Left Ear: External ear normal.  Eyes: Conjunctivae and EOM are normal.  Neck: Normal range of motion.  Cardiovascular: Normal rate and normal heart sounds.   Pulmonary/Chest: Breath sounds normal. He exhibits tenderness.  Tenderness right anterolateral chest wall without bruising  Abdominal: Bowel sounds are normal.  Musculoskeletal: Normal range of motion. He exhibits no edema and no tenderness.  Neurological: He is  alert and oriented to person, place, and time.  Chronic mild left leg weakness No drift of the outstretched arms  Psychiatric: He has a normal mood and affect. His behavior is normal.  Mild stable confusion          Assessment & Plan:   Acute febrile illness.  Appears improved. Dementia.  Recent exacerbation with febrile illness Status post fall.  Chest wall pain.  No history of head trauma Macular degeneration Gait instability Hypertension stable  Continue Tylenol for 24 hours and then observe off medication Force fluids Check screening lab Call if any further issues

## 2014-04-07 LAB — POCT URINALYSIS DIPSTICK
BILIRUBIN UA: NEGATIVE
Glucose, UA: NEGATIVE
Ketones, UA: NEGATIVE
Leukocytes, UA: NEGATIVE
Nitrite, UA: NEGATIVE
Spec Grav, UA: 1.025
Urobilinogen, UA: 0.2
pH, UA: 5.5

## 2014-04-12 ENCOUNTER — Encounter: Payer: Self-pay | Admitting: Family Medicine

## 2014-04-12 ENCOUNTER — Ambulatory Visit (INDEPENDENT_AMBULATORY_CARE_PROVIDER_SITE_OTHER): Payer: Medicare Other | Admitting: Family Medicine

## 2014-04-12 VITALS — BP 132/78 | HR 64 | Wt 155.0 lb

## 2014-04-12 DIAGNOSIS — R609 Edema, unspecified: Secondary | ICD-10-CM

## 2014-04-12 DIAGNOSIS — I10 Essential (primary) hypertension: Secondary | ICD-10-CM

## 2014-04-12 DIAGNOSIS — R600 Localized edema: Secondary | ICD-10-CM

## 2014-04-12 NOTE — Progress Notes (Signed)
Pre visit review using our clinic review tool, if applicable. No additional management support is needed unless otherwise documented below in the visit note. 

## 2014-04-12 NOTE — Progress Notes (Signed)
   Subjective:    Patient ID: Ronald PhenesJames L Barre, male    DOB: 04/06/1921, 80 y.o.   MRN: [id number]  HPI Patient seen with asymmetric leg edema involving the left leg. He was seen just last week with febrile illness and malaise. He had lab work including TSH which was normal. White blood cell count of 16,000. Creatinine elevated at 1.8 thousand. He held lisinopril and HCTZ has been hydrating well and fever has resolved. Couple days ago family noted some asymmetric edema left lower extremity. No leg pain. No thigh pain. No complaints of dyspnea. No cough. No pleuritic pain. No hemoptysis.  He has not had any recurrent fever. He has some cognitive impairment at baseline. His blood pressures been stable off lisinopril HCTZ. He remains on Plavix one daily. Ongoing nicotine use. Family states he is eating and drinking fairly well.  Past Medical History  Diagnosis Date  . Hypertension   . Stroke   . Kidney stone    Past Surgical History  Procedure Laterality Date  . Cholecystectomy      reports that he has been smoking.  He does not have any smokeless tobacco history on file. He reports that he does not drink alcohol. His drug history is not on file. family history is not on file. No Known Allergies    Review of Systems  Constitutional: Negative for fever, chills and appetite change.  Respiratory: Negative for cough.   Cardiovascular: Positive for leg swelling (left only). Negative for chest pain.  Gastrointestinal: Negative for abdominal pain.  Genitourinary: Negative for dysuria.  Neurological: Negative for dizziness.       Objective:   Physical Exam  Constitutional: He appears well-developed and well-nourished.  Neck: Neck supple.  Cardiovascular: Normal rate.   Pulmonary/Chest: Effort normal and breath sounds normal. No respiratory distress. He has no wheezes. He has no rales.  Musculoskeletal:  Patient has 1+ pitting edema left lower extremity. No abnormal color changes. Left foot  is warm to touch. Good capillary refill. No calf tenderness. No left popliteal edema.  Neurological: He is alert.          Assessment & Plan:  #1 asymmetric edema involving left leg. Rule out DVT though he does not have any localized tenderness or other findings to suggest. No prior history of DVT. Setup venous Doppler. #2 recent acute worsening of renal function with creatinine up to 1.8. Probably related to some dehydration. Continue to hold lisinopril HCTZ. Has followup in a couple weeks and repeat basic metabolic panel then. #3 hypertension which is currently stable off lisinopril HCTZ.

## 2014-04-12 NOTE — Patient Instructions (Signed)
We will call you with venous doppler x-ray of leg.

## 2014-04-13 ENCOUNTER — Ambulatory Visit (HOSPITAL_COMMUNITY): Payer: Medicare Other | Attending: Cardiology | Admitting: Cardiology

## 2014-04-13 DIAGNOSIS — R600 Localized edema: Secondary | ICD-10-CM

## 2014-04-13 DIAGNOSIS — F17200 Nicotine dependence, unspecified, uncomplicated: Secondary | ICD-10-CM | POA: Insufficient documentation

## 2014-04-13 DIAGNOSIS — I10 Essential (primary) hypertension: Secondary | ICD-10-CM | POA: Insufficient documentation

## 2014-04-13 DIAGNOSIS — M7989 Other specified soft tissue disorders: Secondary | ICD-10-CM

## 2014-04-13 NOTE — Progress Notes (Signed)
Lower venous duplex unilateral completed. 

## 2014-04-14 ENCOUNTER — Telehealth: Payer: Self-pay

## 2014-04-14 NOTE — Telephone Encounter (Signed)
Message copied by Thomasena EdisFLOYD, MONTRICE E on Wed Apr 14, 2014  8:52 AM ------      Message from: Kristian CoveyBURCHETTE, BRUCE W      Created: Tue Apr 13, 2014  1:54 PM      Regarding: FW: prelim       Let pt know venous doppler negative for DVT.       ----- Message -----         From: Richardean CanalGina M Germino         Sent: 04/13/2014  11:35 AM           To: Kristian CoveyBruce W Burchette, MD      Subject: prelim                                                   Lower venous duplex of the left leg appears negative for acute thrombus.        ------

## 2014-04-14 NOTE — Telephone Encounter (Signed)
Pt informed

## 2014-05-03 ENCOUNTER — Telehealth: Payer: Self-pay | Admitting: Family Medicine

## 2014-05-03 ENCOUNTER — Ambulatory Visit (INDEPENDENT_AMBULATORY_CARE_PROVIDER_SITE_OTHER)
Admission: RE | Admit: 2014-05-03 | Discharge: 2014-05-03 | Disposition: A | Discharge status: Home / Self Care | Payer: Medicare Other | Source: Ambulatory Visit | Attending: Family Medicine | Admitting: Family Medicine

## 2014-05-03 ENCOUNTER — Ambulatory Visit (INDEPENDENT_AMBULATORY_CARE_PROVIDER_SITE_OTHER): Payer: Medicare Other | Admitting: Family Medicine

## 2014-05-03 ENCOUNTER — Encounter: Payer: Self-pay | Admitting: Family Medicine

## 2014-05-03 VITALS — BP 162/74 | HR 60 | Wt 149.0 lb

## 2014-05-03 DIAGNOSIS — N179 Acute kidney failure, unspecified: Secondary | ICD-10-CM

## 2014-05-03 DIAGNOSIS — Z8679 Personal history of other diseases of the circulatory system: Secondary | ICD-10-CM

## 2014-05-03 DIAGNOSIS — R059 Cough, unspecified: Secondary | ICD-10-CM

## 2014-05-03 DIAGNOSIS — R634 Abnormal weight loss: Secondary | ICD-10-CM

## 2014-05-03 DIAGNOSIS — R9389 Abnormal findings on diagnostic imaging of other specified body structures: Secondary | ICD-10-CM

## 2014-05-03 DIAGNOSIS — I10 Essential (primary) hypertension: Secondary | ICD-10-CM

## 2014-05-03 DIAGNOSIS — R05 Cough: Secondary | ICD-10-CM

## 2014-05-03 LAB — COMPREHENSIVE METABOLIC PANEL
ALBUMIN: 3.3 g/dL — AB (ref 3.5–5.2)
ALK PHOS: 71 U/L (ref 39–117)
ALT: 10 U/L (ref 0–53)
AST: 18 U/L (ref 0–37)
BUN: 18 mg/dL (ref 6–23)
CALCIUM: 8.8 mg/dL (ref 8.4–10.5)
CHLORIDE: 110 meq/L (ref 96–112)
CO2: 30 mEq/L (ref 19–32)
Creatinine, Ser: 0.9 mg/dL (ref 0.4–1.5)
GFR: 85.06 mL/min (ref >60.00)
Glucose, Bld: 95 mg/dL (ref 70–99)
POTASSIUM: 3.5 meq/L (ref 3.5–5.1)
Sodium: 145 mEq/L (ref 135–145)
Total Bilirubin: 1.1 mg/dL (ref 0.2–1.2)
Total Protein: 6.4 g/dL (ref 6.0–8.3)

## 2014-05-03 MED ORDER — LOSARTAN POTASSIUM-HCTZ 50-12.5 MG PO TABS: 1.0000 | ORAL_TABLET | Freq: Every day | ORAL | Status: DC

## 2014-05-03 NOTE — Progress Notes (Addendum)
   Subjective:    Patient ID: Ronald [name], male    DOB: 02/08/1921, 80 y.o.   MRN: [id number]  HPI Patient seen for medical followup long. Long history of nicotine use. He has hypertension and history of cerebrovascular disease. He has macular degeneration. Has been stable for several years. He recently came in with some left lower extremity edema. Venous Doppler negative for clot. He had been on lisinopril HCTZ but blood work revealed elevated creatinine 1.8. His been off lisinopril HCTZ. Has lost 6 pounds since last visit. Son does state that his appetite has been good.  Has had dry cough for the past few weeks. No hemoptysis. No abdominal pain. No specific complaints. Denies depression  Past Medical History  Diagnosis Date  . Hypertension   . Stroke   . Kidney stone    Past Surgical History  Procedure Laterality Date  . Cholecystectomy      reports that he has been smoking.  He does not have any smokeless tobacco history on file. He reports that he does not drink alcohol. His drug history is not on file. family history is not on file. No Known Allergies    Review of Systems  Constitutional: Positive for unexpected weight change. Negative for fever, chills, appetite change and fatigue.  Eyes: Negative for visual disturbance.  Respiratory: Negative for cough, chest tightness and shortness of breath.   Cardiovascular: Negative for chest pain, palpitations and leg swelling.  Gastrointestinal: Negative for abdominal pain.  Endocrine: Negative for polydipsia and polyuria.  Neurological: Negative for dizziness, syncope, weakness, light-headedness and headaches.       Objective:   Physical Exam  Constitutional: He appears well-developed and well-nourished. No distress.  Neck: Neck supple.  Cardiovascular: Normal rate.   Pulmonary/Chest: Effort normal and breath sounds normal.  Somewhat diminished breath sounds throughout but clear. No wheezes or rales  Musculoskeletal: He  exhibits edema.  Trace edema lower legs bilaterally left greater than right          Assessment & Plan:  #1 hypertension. Back elevated today with 162/74.  Repeat comprehensive metabolic panel. Start losartan HCTZ 50/12.5 one daily #2 cough with weight loss. Long history of smoking. Set up chest x-ray to further evaluate #3 acute renal failure with recent creatinine 1.8. Recheck comprehensive metabolic panel. Increase hydration  Patchy/nodular opacity right lung base. Question of pneumonia versus malignancy. Had no recent fever. Set up CT chest to further evaluate

## 2014-05-03 NOTE — Addendum Note (Signed)
Addended by: Kristian CoveyBURCHETTE, Aila Terra W on: 05/03/2014 01:02 PM   Modules accepted: Orders

## 2014-05-03 NOTE — Telephone Encounter (Signed)
Pt son is not on DPR however he stated he bring his father to every appt and son Jorja Loaim is requesting xray results and bloodwork. Pt son tim stated his dad will not remember any info we will give him.

## 2014-05-03 NOTE — Telephone Encounter (Signed)
Pt and son informed.

## 2014-05-03 NOTE — Progress Notes (Signed)
Pre visit review using our clinic review tool, if applicable. No additional management support is needed unless otherwise documented below in the visit note. 

## 2014-05-03 NOTE — Telephone Encounter (Signed)
Results are in chart

## 2014-05-03 NOTE — Telephone Encounter (Signed)
Right lung base ?opacity.  Radiology recommending CT chest and I have ordered.

## 2014-05-04 ENCOUNTER — Other Ambulatory Visit: Payer: Medicare Other

## 2014-05-06 ENCOUNTER — Ambulatory Visit (INDEPENDENT_AMBULATORY_CARE_PROVIDER_SITE_OTHER)
Admission: RE | Admit: 2014-05-06 | Discharge: 2014-05-06 | Disposition: A | Discharge status: Home / Self Care | Payer: Medicare Other | Source: Ambulatory Visit | Attending: Family Medicine | Admitting: Family Medicine

## 2014-05-06 DIAGNOSIS — R9389 Abnormal findings on diagnostic imaging of other specified body structures: Secondary | ICD-10-CM

## 2014-05-06 MED ORDER — IOHEXOL 300 MG/ML  SOLN
80.0000 mL | Freq: Once | INTRAMUSCULAR | Status: AC | PRN
  Administered 2014-05-06: 80 mL via INTRAVENOUS

## 2014-06-03 ENCOUNTER — Encounter: Payer: Self-pay | Admitting: Family Medicine

## 2014-06-03 ENCOUNTER — Ambulatory Visit (INDEPENDENT_AMBULATORY_CARE_PROVIDER_SITE_OTHER): Payer: Medicare Other | Admitting: Family Medicine

## 2014-06-03 ENCOUNTER — Ambulatory Visit: Payer: Medicare Other | Admitting: Family Medicine

## 2014-06-03 VITALS — BP 170/80 | HR 60 | Wt 153.0 lb

## 2014-06-03 DIAGNOSIS — R911 Solitary pulmonary nodule: Secondary | ICD-10-CM

## 2014-06-03 DIAGNOSIS — I10 Essential (primary) hypertension: Secondary | ICD-10-CM

## 2014-06-03 MED ORDER — LOSARTAN POTASSIUM-HCTZ 50-12.5 MG PO TABS
1.0000 | ORAL_TABLET | Freq: Every day | ORAL | Status: DC
Start: 2014-06-03 — End: 2015-03-10

## 2014-06-03 NOTE — Progress Notes (Signed)
   Subjective:    Patient ID: Ronald PhenesJames L [name], male    DOB: 04/18/1921, 80 y.o.   MRN: [id number]  HPI   Followup hypertension. Patient recently had some asymmetric leg edema. Venous Dopplers negative. Chest x-ray revealed possible pulmonary nodule. CT scan revealed indeterminate 7 mm right lung nodule. He has no cough at this time. No hemoptysis. He has gained 4 pounds since last visit and appetite is excellent. Family did not start back his blood pressure medication with losartan HCTZ. Previously took lisinopril but had some dry cough and we have had him change as above. No peripheral edema issues. No dizziness. Previous stroke x2. Ongoing nicotine use. He takes Plavix regularly  Past Medical History  Diagnosis Date  . Hypertension   . Stroke   . Kidney stone    Past Surgical History  Procedure Laterality Date  . Cholecystectomy      reports that he has been smoking.  He does not have any smokeless tobacco history on file. He reports that he does not drink alcohol. His drug history is not on file. family history is not on file. No Known Allergies    Review of Systems  Constitutional: Negative for fatigue.  Eyes: Negative for visual disturbance.  Respiratory: Negative for cough, chest tightness and shortness of breath.   Cardiovascular: Negative for chest pain, palpitations and leg swelling.  Neurological: Negative for dizziness, syncope, weakness, light-headedness and headaches.       Objective:   Physical Exam  Constitutional: He is oriented to person, place, and time. He appears well-developed and well-nourished.  HENT:  Mouth/Throat: Oropharynx is clear and moist.  Neck: Neck supple. No thyromegaly present.  Cardiovascular: Normal rate and regular rhythm.   Pulmonary/Chest: Effort normal and breath sounds normal. No respiratory distress. He has no wheezes. He has no rales.  Musculoskeletal: He exhibits no edema.  Neurological: He is alert and oriented to person, place, and  time.          Assessment & Plan:  Hypertension. Poorly controlled with left and right arm seated a 170/80. Start losartan HCTZ 50/12.5 one daily. Monitor blood pressure closely at home. Reassess here in 3 months  Indeterminate small pulmonary nodule on CT lung. Consider repeat CT 6 months ago at his age we had long discussion today with son and patient and they are not sure they wish to pursue that at this time. Reminder for flu vaccine this fall

## 2014-06-03 NOTE — Progress Notes (Signed)
Pre visit review using our clinic review tool, if applicable. No additional management support is needed unless otherwise documented below in the visit note. 

## 2014-06-03 NOTE — Patient Instructions (Signed)
Start Losartan HCTZ one daily and  Monitor blood pressure and be in touch if consistently >150/90

## 2014-09-02 ENCOUNTER — Ambulatory Visit (INDEPENDENT_AMBULATORY_CARE_PROVIDER_SITE_OTHER): Payer: Medicare Other

## 2014-09-02 ENCOUNTER — Encounter: Payer: Self-pay | Admitting: Family Medicine

## 2014-09-02 ENCOUNTER — Ambulatory Visit (INDEPENDENT_AMBULATORY_CARE_PROVIDER_SITE_OTHER): Payer: Medicare Other | Admitting: Family Medicine

## 2014-09-02 VITALS — BP 150/90 | HR 63 | Temp 97.7°F | Wt 162.0 lb

## 2014-09-02 DIAGNOSIS — I10 Essential (primary) hypertension: Secondary | ICD-10-CM

## 2014-09-02 DIAGNOSIS — Z23 Encounter for immunization: Secondary | ICD-10-CM

## 2014-09-02 NOTE — Progress Notes (Signed)
   Subjective:    Patient ID: Ronald [name], male    DOB: 03/04/1921, 80 y.o.   MRN: [id number]  HPI Follow-up hypertension. He had some dry cough over the summer and we switched him from lisinopril HCTZ to losartan HCTZ. He's done well since that time with no further cough. Home blood pressures been mostly 130-150 range systolic. Overall he feels well. He has had good appetite and good fluid intake. No history of any chronic kidney disease. He is still smoking about 4-5 cigarettes per day  Past Medical History  Diagnosis Date  . Hypertension   . Stroke   . Kidney stone    Past Surgical History  Procedure Laterality Date  . Cholecystectomy      reports that he has been smoking.  He does not have any smokeless tobacco history on file. He reports that he does not drink alcohol. His drug history is not on file. family history is not on file. No Known Allergies    Review of Systems  Constitutional: Negative for fatigue.  Eyes: Negative for visual disturbance.  Respiratory: Negative for cough, chest tightness and shortness of breath.   Cardiovascular: Negative for chest pain, palpitations and leg swelling.  Neurological: Negative for dizziness, syncope, weakness, light-headedness and headaches.       Objective:   Physical Exam  Constitutional: He appears well-developed and well-nourished.  Cardiovascular: Normal rate and regular rhythm.   Pulmonary/Chest: Effort normal and breath sounds normal. No respiratory distress. He has no wheezes. He has no rales.  Musculoskeletal: He exhibits no edema.          Assessment & Plan:  Hypertension. This is been controlled by home readings. Given his age and lack of chronic kidney disease or diabetes goal of less than 150/90. He appears to be at goal. Continue current medication. Flu vaccine are given. Reassess 6 months and basic metabolic panel then

## 2014-09-02 NOTE — Patient Instructions (Signed)
Potassium Content of Foods  Potassium is a mineral found in many foods and drinks. It helps keep fluids and minerals balanced in your body and affects how steadily your heart beats. Potassium also helps control your blood pressure and keep your muscles and nervous system healthy.  Certain health conditions and medicines may change the balance of potassium in your body. When this happens, you can help balance your level of potassium through the foods that you do or do not eat. Your health care provider or dietitian may recommend an amount of potassium that you should have each day. The following lists of foods provide the amount of potassium (in parentheses) per serving in each item.  HIGH IN POTASSIUM   The following foods and beverages have 200 mg or more of potassium per serving:  · Apricots, 2 raw or 5 dry (200 mg).  · Artichoke, 1 medium (345 mg).  · Avocado, raw,  ¼ each (245 mg).  · Banana, 1 medium (425 mg).  · Beans, lima, or baked beans, canned, ½ cup (280 mg).  · Beans, white, canned, ½ cup (595 mg).  · Beef roast, 3 oz (320 mg).  · Beef, ground, 3 oz (270 mg).  · Beets, raw or cooked, ½ cup (260 mg).  · Bran muffin, 2 oz (300 mg).  · Broccoli, ½ cup (230 mg).  · Brussels sprouts, ½ cup (250 mg).  · Cantaloupe, ½ cup (215 mg).  · Cereal, 100% bran, ½ cup (200-400 mg).  · Cheeseburger, single, fast food, 1 each (225-400 mg).  · Chicken, 3 oz (220 mg).  · Clams, canned, 3 oz (535 mg).  · Crab, 3 oz (225 mg).  · Dates, 5 each (270 mg).  · Dried beans and peas, ½ cup (300-475 mg).  · Figs, dried, 2 each (260 mg).  · Fish: halibut, tuna, cod, snapper, 3 oz (480 mg).  · Fish: salmon, haddock, swordfish, perch, 3 oz (300 mg).  · Fish, tuna, canned 3 oz (200 mg).  · French fries, fast food, 3 oz (470 mg).  · Granola with fruit and nuts, ½ cup (200 mg).  · Grapefruit juice, ½ cup (200 mg).  · Greens, beet, ½ cup (655 mg).  · Honeydew melon, ½ cup (200 mg).  · Kale, raw, 1 cup (300 mg).  · Kiwi, 1 medium (240  mg).  · Kohlrabi, rutabaga, parsnips, ½ cup (280 mg).  · Lentils, ½ cup (365 mg).  · Mango, 1 each (325 mg).  · Milk, chocolate, 1 cup (420 mg).  · Milk: nonfat, low-fat, whole, buttermilk, 1 cup (350-380 mg).  · Molasses, 1 Tbsp (295 mg).  · Mushrooms, ½ cup (280) mg.  · Nectarine, 1 each (275 mg).  · Nuts: almonds, peanuts, hazelnuts, [place], cashew, mixed, 1 oz (200 mg).  · Nuts, pistachios, 1 oz (295 mg).  · Orange, 1 each (240 mg).  · Orange juice, ½ cup (235 mg).  · Papaya, medium, ½ fruit (390 mg).  · Peanut butter, chunky, 2 Tbsp (240 mg).  · Peanut butter, smooth, 2 Tbsp (210 mg).  · Pear, 1 medium (200 mg).  · Pomegranate, 1 whole (400 mg).  · Pomegranate juice, ½ cup (215 mg).  · Pork, 3 oz (350 mg).  · Potato chips, salted, 1 oz (465 mg).  · Potato, baked with skin, 1 medium (925 mg).  · Potatoes, boiled, ½ cup (255 mg).  · Potatoes, mashed, ½ cup (330 mg).  · Prune juice, ½ cup (  370 mg).  · Prunes, 5 each (305 mg).  · Pudding, chocolate, ½ cup (230 mg).  · Pumpkin, canned, ½ cup (250 mg).  · Raisins, seedless, ¼ cup (270 mg).  · Seeds, sunflower or pumpkin, 1 oz (240 mg).  · Soy milk, 1 cup (300 mg).  · Spinach, ½ cup (420 mg).  · Spinach, canned, ½ cup (370 mg).  · Sweet potato, baked with skin, 1 medium (450 mg).  · Swiss chard, ½ cup (480 mg).  · Tomato or vegetable juice, ½ cup (275 mg).  · Tomato sauce or puree, ½ cup (400-550 mg).  · Tomato, raw, 1 medium (290 mg).  · Tomatoes, canned, ½ cup (200-300 mg).  · Turkey, 3 oz (250 mg).  · Wheat germ, 1 oz (250 mg).  · Winter squash, ½ cup (250 mg).  · Yogurt, plain or fruited, 6 oz (260-435 mg).  · Zucchini, ½ cup (220 mg).  MODERATE IN POTASSIUM  The following foods and beverages have 50-200 mg of potassium per serving:  · Apple, 1 each (150 mg).  · Apple juice, ½ cup (150 mg).  · Applesauce, ½ cup (90 mg).  · Apricot nectar, ½ cup (140 mg).  · Asparagus, small spears, ½ cup or 6 spears (155 mg).  · Bagel, cinnamon raisin, 1 each (130 mg).  · Bagel,  egg or plain, 4 in., 1 each (70 mg).  · Beans, green, ½ cup (90 mg).  · Beans, yellow, ½ cup (190 mg).  · Beer, regular, 12 oz (100 mg).  · Beets, canned, ½ cup (125 mg).  · Blackberries, ½ cup (115 mg).  · Blueberries, ½ cup (60 mg).  · Bread, whole wheat, 1 slice (70 mg).  · Broccoli, raw, ½ cup (145 mg).  · Cabbage, ½ cup (150 mg).  · Carrots, cooked or raw, ½ cup (180 mg).  · Cauliflower, raw, ½ cup (150 mg).  · Celery, raw, ½ cup (155 mg).  · Cereal, bran flakes, ½cup (120-150 mg).  · Cheese, cottage, ½ cup (110 mg).  · Cherries, 10 each (150 mg).  · Chocolate, 1½ oz bar (165 mg).  · Coffee, brewed 6 oz (90 mg).  · Corn, ½ cup or 1 ear (195 mg).  · Cucumbers, ½ cup (80 mg).  · Egg, large, 1 each (60 mg).  · Eggplant, ½ cup (60 mg).  · Endive, raw, ½cup (80 mg).  · English muffin, 1 each (65 mg).  · Fish, orange roughy, 3 oz (150 mg).  · Frankfurter, beef or pork, 1 each (75 mg).  · Fruit cocktail, ½ cup (115 mg).  · Grape juice, ½ cup (170 mg).  · Grapefruit, ½ fruit (175 mg).  · Grapes, ½ cup (155 mg).  · Greens: kale, turnip, collard, ½ cup (110-150 mg).  · Ice cream or frozen yogurt, chocolate, ½ cup (175 mg).  · Ice cream or frozen yogurt, vanilla, ½ cup (120-150 mg).  · Lemons, limes, 1 each (80 mg).  · Lettuce, all types, 1 cup (100 mg).  · Mixed vegetables, ½ cup (150 mg).  · Mushrooms, raw, ½ cup (110 mg).  · Nuts: walnuts, pecans, or macadamia, 1 oz (125 mg).  · Oatmeal, ½ cup (80 mg).  · Okra, ½ cup (110 mg).  · Onions, raw, ½ cup (120 mg).  · Peach, 1 each (185 mg).  · Peaches, canned, ½ cup (120 mg).  · Pears, canned, ½ cup (120 mg).  · Peas, green,   frozen, ½ cup (90 mg).  · Peppers, green, ½ cup (130 mg).  · Peppers, red, ½ cup (160 mg).  · Pineapple juice, ½ cup (165 mg).  · Pineapple, fresh or canned, ½ cup (100 mg).  · Plums, 1 each (105 mg).  · Pudding, vanilla, ½ cup (150 mg).  · Raspberries, ½ cup (90 mg).  · Rhubarb, ½ cup (115 mg).  · Rice, wild, ½ cup (80 mg).  · Shrimp, 3 oz (155  mg).  · Spinach, raw, 1 cup (170 mg).  · Strawberries, ½ cup (125 mg).  · Summer squash ½ cup (175-200 mg).  · Swiss chard, raw, 1 cup (135 mg).  · Tangerines, 1 each (140 mg).  · Tea, brewed, 6 oz (65 mg).  · Turnips, ½ cup (140 mg).  · Watermelon, ½ cup (85 mg).  · Wine, red, table, 5 oz (180 mg).  · Wine, white, table, 5 oz (100 mg).  LOW IN POTASSIUM  The following foods and beverages have less than 50 mg of potassium per serving.  · Bread, white, 1 slice (30 mg).  · Carbonated beverages, 12 oz (less than 5 mg).  · Cheese, 1 oz (20-30 mg).  · Cranberries, ½ cup (45 mg).  · Cranberry juice cocktail, ½ cup (20 mg).  · Fats and oils, 1 Tbsp (less than 5 mg).  · Hummus, 1 Tbsp (32 mg).  · Nectar: papaya, mango, or pear, ½ cup (35 mg).  · Rice, white or brown, ½ cup (50 mg).  · Spaghetti or macaroni, ½ cup cooked (30 mg).  · Tortilla, flour or corn, 1 each (50 mg).  · Waffle, 4 in., 1 each (50 mg).  · Water chestnuts, ½ cup (40 mg).  Document Released: 05/22/2005 Document Revised: 10/13/2013 Document Reviewed: 09/04/2013  [employer]® Patient Information ©2015 [employer]. This information is not intended to replace advice given to you by your health care provider. Make sure you discuss any questions you have with your health care provider.

## 2014-09-02 NOTE — Progress Notes (Signed)
Pre visit review using our clinic review tool, if applicable. No additional management support is needed unless otherwise documented below in the visit note. 

## 2014-09-06 ENCOUNTER — Ambulatory Visit: Payer: Medicare Other | Admitting: Family Medicine

## 2014-11-11 ENCOUNTER — Other Ambulatory Visit: Payer: Self-pay | Admitting: Family Medicine

## 2015-01-07 ENCOUNTER — Encounter (HOSPITAL_COMMUNITY): Payer: Self-pay | Admitting: Emergency Medicine

## 2015-01-07 ENCOUNTER — Emergency Department (HOSPITAL_COMMUNITY): Payer: Medicare Other

## 2015-01-07 ENCOUNTER — Emergency Department (HOSPITAL_COMMUNITY)
Admission: EM | Admit: 2015-01-07 | Discharge: 2015-01-07 | Disposition: A | Discharge status: Home / Self Care | Payer: Medicare Other | Attending: Emergency Medicine | Admitting: Emergency Medicine

## 2015-01-07 DIAGNOSIS — Y9389 Activity, other specified: Secondary | ICD-10-CM | POA: Insufficient documentation

## 2015-01-07 DIAGNOSIS — I10 Essential (primary) hypertension: Secondary | ICD-10-CM | POA: Diagnosis not present

## 2015-01-07 DIAGNOSIS — Y998 Other external cause status: Secondary | ICD-10-CM | POA: Insufficient documentation

## 2015-01-07 DIAGNOSIS — S42302A Unspecified fracture of shaft of humerus, left arm, initial encounter for closed fracture: Secondary | ICD-10-CM

## 2015-01-07 DIAGNOSIS — Z7982 Long term (current) use of aspirin: Secondary | ICD-10-CM | POA: Diagnosis not present

## 2015-01-07 DIAGNOSIS — Z79899 Other long term (current) drug therapy: Secondary | ICD-10-CM | POA: Insufficient documentation

## 2015-01-07 DIAGNOSIS — W010XXA Fall on same level from slipping, tripping and stumbling without subsequent striking against object, initial encounter: Secondary | ICD-10-CM | POA: Diagnosis not present

## 2015-01-07 DIAGNOSIS — Z87442 Personal history of urinary calculi: Secondary | ICD-10-CM | POA: Insufficient documentation

## 2015-01-07 DIAGNOSIS — M25512 Pain in left shoulder: Secondary | ICD-10-CM | POA: Insufficient documentation

## 2015-01-07 DIAGNOSIS — Z8673 Personal history of transient ischemic attack (TIA), and cerebral infarction without residual deficits: Secondary | ICD-10-CM | POA: Insufficient documentation

## 2015-01-07 DIAGNOSIS — Z7902 Long term (current) use of antithrombotics/antiplatelets: Secondary | ICD-10-CM | POA: Diagnosis not present

## 2015-01-07 DIAGNOSIS — Y92197 Garden or yard of other specified residential institution as the place of occurrence of the external cause: Secondary | ICD-10-CM | POA: Insufficient documentation

## 2015-01-07 DIAGNOSIS — Z720 Tobacco use: Secondary | ICD-10-CM | POA: Insufficient documentation

## 2015-01-07 DIAGNOSIS — S4992XA Unspecified injury of left shoulder and upper arm, initial encounter: Secondary | ICD-10-CM | POA: Diagnosis present

## 2015-01-07 MED ORDER — IBUPROFEN 400 MG PO TABS: 400.0000 mg | ORAL_TABLET | Freq: Four times a day (QID) | ORAL | Status: DC | PRN

## 2015-01-07 MED ORDER — FENTANYL CITRATE 0.05 MG/ML IJ SOLN
50.0000 ug | Freq: Once | INTRAMUSCULAR | Status: AC
  Administered 2015-01-07: 50 ug via INTRAVENOUS
  Filled 2015-01-07: qty 2

## 2015-01-07 MED ORDER — OXYCODONE-ACETAMINOPHEN 5-325 MG PO TABS: 1.0000 | ORAL_TABLET | Freq: Four times a day (QID) | ORAL | Status: DC | PRN

## 2015-01-07 MED ORDER — MORPHINE SULFATE 2 MG/ML IJ SOLN: 2.0000 mg | Freq: Once | INTRAMUSCULAR | Status: DC

## 2015-01-07 NOTE — Progress Notes (Signed)
CSW met with pt at bedside. Daughter was present. Patient confirms that he fell today. Daughter states that the pt fell while in the yard today. She states that the pt tripped over a branch outside and injured his shoulder.  Daughter states that the pt lives at home with his wife in [place]. She states that prior to coming into [facility] the pt has been able to complete his ADL's.  Also, daughter informed CSW that the pt has a caregiver come in x4 a week for 5 hours through a private agency. She states the caregivers name is Izora Gala and that she cooks and does light cleaning for the pt. Daughter states that she is not interested in a nursing home for the pt. She appears to be self determined that the pt will be able to manage at home with his caregiver. Daughter also states that she goes to the pt's home on the weekends.  Daughter states that she does not have any questions.  Daughter/Lisa Shinn [phone]  Willette Brace [phone] ED CSW 01/07/2015 10:14 PM

## 2015-01-07 NOTE — ED Provider Notes (Signed)
CSN: [id number]     Arrival date & time 01/07/15  1433 History   First MD Initiated Contact with Patient 01/07/15 1459     Chief Complaint  Patient presents with  . Fall   HPI   81 year old male presents after mechanical fall. Patient reports that he was removing branches from his yard and he tripped falling backwards landing on his left shoulder. Patient reports immediate pain to the anterior shoulder, and was unable unable to get to his feet due to the pain. He states that his neighbor saw him and called his daughter who eventually called the ambulance for transport. Patient denies dizziness headache loss of consciousness or heart palpitations prior to the fall, and states it was because the root in the ground. A presentation patient reports only left shoulder pain, denying any other complaints. He denies headache nausea vomiting chest pain neck pain back pain, pain to the extremities. Patient denies any history of injuries to the shoulder as well.   Past Medical History  Diagnosis Date  . Hypertension   . Stroke   . Kidney stone    Past Surgical History  Procedure Laterality Date  . Cholecystectomy     History reviewed. No pertinent family history. History  Substance Use Topics  . Smoking status: Current Some Day Smoker  . Smokeless tobacco: Not on file  . Alcohol Use: No    Review of Systems  All other systems reviewed and are negative.  Allergies  Review of patient's allergies indicates no known allergies.  Home Medications   Prior to Admission medications   Medication Sig Start Date End Date Taking? Authorizing Provider  aspirin 81 MG tablet Take 81 mg by mouth every hour as needed.     Yes Historical Provider, MD  clopidogrel (PLAVIX) 75 MG tablet TAKE ONE TABLET BY MOUTH ONCE DAILY 11/11/14  Yes Kristian CoveyBruce W Burchette, MD  losartan-hydrochlorothiazide (HYZAAR) 50-12.5 MG per tablet Take 1 tablet by mouth daily. 06/03/14  Yes Kristian CoveyBruce W Burchette, MD  mometasone (ELOCON) 0.1 %  lotion Apply topically daily as needed.    Yes Historical Provider, MD   BP 209/104 mmHg  Pulse 92  Resp 19  SpO2 97% Physical Exam  Constitutional: He is oriented to person, place, and time. He appears well-developed and well-nourished.  HENT:  Head: Normocephalic and atraumatic.  Eyes: Pupils are equal, round, and reactive to light.  Neck: Normal range of motion. Neck supple. No JVD present. No tracheal deviation present. No thyromegaly present.  Cardiovascular: Normal rate, regular rhythm, normal heart sounds and intact distal pulses.  Exam reveals no gallop and no friction rub.   No murmur heard. Pulmonary/Chest: Effort normal and breath sounds normal. No stridor. No respiratory distress. He has no wheezes. He has no rales. He exhibits no tenderness.  Abdominal: Soft. Bowel sounds are normal. He exhibits no distension. There is no tenderness. There is no rebound.  Musculoskeletal: Normal range of motion.  Vital signs of trauma to the left shoulder or extremity; no bruising swelling, erythema, deformities noted. Pain with internal/external rate rotation of the glenohumeral joint. No pain with pronation supination, distal strength and sensation and vascular status intact.  No C T or L spine tenderness. AP lateral compression of the chest pain-free. Abdomen soft nontender hips stable pain-free. Full range of motion with resistance to lower extremities; no signs of trauma.  Lymphadenopathy:    He has no cervical adenopathy.  Neurological: He is alert and oriented to person, place, and time.  Coordination normal.  Skin: Skin is warm and dry.  Psychiatric: He has a normal mood and affect. His behavior is normal. Judgment and thought content normal.  Nursing note and vitals reviewed.   ED Course  Procedures (including critical care time) Labs Review Labs Reviewed - No data to display  Imaging Review Dg Shoulder Left  01/07/2015   CLINICAL DATA:  Pain following fall  EXAM: LEFT SHOULDER  - 2+ VIEW  COMPARISON:  None.  FINDINGS: Frontal and lateral views were obtained. There is a comminuted fracture of the proximal humeral diaphysis with posterior and lateral angulation of the distal major fracture fragment with respect proximal major fragment. No other fractures. No dislocation. There is moderate generalized osteoarthritic change in the shoulder joint region. No erosive change.  IMPRESSION: Comminuted fracture proximal humeral diaphysis. 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   Electronically Signed   By: Bretta Bang III M.D.   On: 01/07/2015 16:07     EKG Interpretation None      MDM   Final diagnoses:  Humerus fracture, left, closed, initial encounter    Imaging: DG left shoulder comminuted fracture proximal humerus diaphysis  Therapeutics: Fentanyl  Assessment: Comminuted fracture of the humerus  Plan: Patient was advised to use sling, ice, rest, ibuprofen, and Percocet for pain. Patient's daughter was at bedside during evaluation and care discussion. Patient was advised to use sling immobilizer as directed. Patient was instructed on return instructions including worsening signs or symptoms. Advised follow-up with orthopedist as indicated in discharged instructions. Patient and daughter had no questions or concerns at time of discharge.      Eyvonne Mechanic, PA-C 01/07/15 1715  Eber Hong, MD 01/09/15 [phone]

## 2015-01-07 NOTE — ED Notes (Signed)
Patient transported to X-ray 

## 2015-01-07 NOTE — ED Provider Notes (Signed)
Patient states that he was mowing his yard, got off of his riding lawnmower and tripped over a branch, fell onto his left shoulder, he fell downhill, he was unable to get up by himself, a neighbor witnessed this and called for help. The patient complains of pain in the left shoulder, there is associated significant swelling of the left shoulder but no tenderness over the clavicle, no tenderness with range of motion of the elbow or the wrist, there is tenderness with range of motion of the left shoulder. Heart and lungs are normal, no A. fib, strong pulses, no peripheral edema, mental status is otherwise normal, no signs of head injury or trauma, no spinal tenderness of the cervical spine. Check chest x-ray, pain control, reevaluate, immobilize, ice.  Medical screening examination/treatment/procedure(s) were conducted as a shared visit with non-physician practitioner(s) and myself.  I personally evaluated the patient during the encounter.  Clinical Impression:   Final diagnoses:  Humerus fracture, left, closed, initial encounter         Eber HongBrian Kwanza Cancelliere, MD 01/09/15 [phone]

## 2015-01-07 NOTE — ED Notes (Signed)
Pt had fall in yard today. Pt states got off of lawn mower and tripped over limb. Pt fell on left shoulder and is only c/o pain to left shoulder with movement. Pt is bboard and ccollar.

## 2015-01-07 NOTE — ED Notes (Signed)
Bed: WA08 Expected date:  Expected time:  Means of arrival:  Comments: EMS-fall 

## 2015-01-07 NOTE — Discharge Instructions (Signed)
Patient and daughter were advised to use sling immobilizer as instructed. Rest and ice, ibuprofen, Percocet as needed for pain. Monitor for worsening signs and symptoms including increasing pain, swelling, loss of distal sensation or strength. Please return immediately if anything the symptoms present. Follow-up with orthopedist.

## 2015-03-05 ENCOUNTER — Emergency Department (HOSPITAL_COMMUNITY): Payer: Medicare Other

## 2015-03-05 ENCOUNTER — Inpatient Hospital Stay (HOSPITAL_COMMUNITY)
Admission: EM | Admit: 2015-03-05 | Discharge: 2015-03-10 | DRG: 065 | Disposition: A | Discharge status: Skilled Nursing Facility | Payer: Medicare Other | Attending: Internal Medicine | Admitting: Internal Medicine

## 2015-03-05 ENCOUNTER — Encounter (HOSPITAL_COMMUNITY): Payer: Self-pay | Admitting: Emergency Medicine

## 2015-03-05 DIAGNOSIS — I509 Heart failure, unspecified: Secondary | ICD-10-CM | POA: Diagnosis not present

## 2015-03-05 DIAGNOSIS — R29810 Facial weakness: Secondary | ICD-10-CM | POA: Diagnosis present

## 2015-03-05 DIAGNOSIS — Z7982 Long term (current) use of aspirin: Secondary | ICD-10-CM | POA: Diagnosis not present

## 2015-03-05 DIAGNOSIS — G4730 Sleep apnea, unspecified: Secondary | ICD-10-CM | POA: Diagnosis present

## 2015-03-05 DIAGNOSIS — Z7902 Long term (current) use of antithrombotics/antiplatelets: Secondary | ICD-10-CM | POA: Diagnosis not present

## 2015-03-05 DIAGNOSIS — G8194 Hemiplegia, unspecified affecting left nondominant side: Secondary | ICD-10-CM | POA: Diagnosis present

## 2015-03-05 DIAGNOSIS — S42302A Unspecified fracture of shaft of humerus, left arm, initial encounter for closed fracture: Secondary | ICD-10-CM | POA: Diagnosis not present

## 2015-03-05 DIAGNOSIS — I6502 Occlusion and stenosis of left vertebral artery: Secondary | ICD-10-CM | POA: Diagnosis present

## 2015-03-05 DIAGNOSIS — R339 Retention of urine, unspecified: Secondary | ICD-10-CM | POA: Diagnosis not present

## 2015-03-05 DIAGNOSIS — I638 Other cerebral infarction: Secondary | ICD-10-CM | POA: Diagnosis not present

## 2015-03-05 DIAGNOSIS — F17210 Nicotine dependence, cigarettes, uncomplicated: Secondary | ICD-10-CM | POA: Diagnosis present

## 2015-03-05 DIAGNOSIS — R410 Disorientation, unspecified: Secondary | ICD-10-CM | POA: Diagnosis present

## 2015-03-05 DIAGNOSIS — R531 Weakness: Secondary | ICD-10-CM | POA: Diagnosis not present

## 2015-03-05 DIAGNOSIS — F0390 Unspecified dementia without behavioral disturbance: Secondary | ICD-10-CM | POA: Diagnosis present

## 2015-03-05 DIAGNOSIS — R29898 Other symptoms and signs involving the musculoskeletal system: Secondary | ICD-10-CM | POA: Diagnosis present

## 2015-03-05 DIAGNOSIS — I10 Essential (primary) hypertension: Secondary | ICD-10-CM | POA: Diagnosis present

## 2015-03-05 DIAGNOSIS — R739 Hyperglycemia, unspecified: Secondary | ICD-10-CM | POA: Diagnosis present

## 2015-03-05 DIAGNOSIS — R000 Tachycardia, unspecified: Secondary | ICD-10-CM | POA: Diagnosis present

## 2015-03-05 DIAGNOSIS — S42302S Unspecified fracture of shaft of humerus, left arm, sequela: Secondary | ICD-10-CM | POA: Diagnosis not present

## 2015-03-05 DIAGNOSIS — S42302G Unspecified fracture of shaft of humerus, left arm, subsequent encounter for fracture with delayed healing: Secondary | ICD-10-CM | POA: Diagnosis not present

## 2015-03-05 DIAGNOSIS — I639 Cerebral infarction, unspecified: Secondary | ICD-10-CM | POA: Diagnosis present

## 2015-03-05 LAB — URINALYSIS, ROUTINE W REFLEX MICROSCOPIC
Bilirubin Urine: NEGATIVE
Glucose, UA: NEGATIVE mg/dL
Ketones, ur: NEGATIVE mg/dL
Leukocytes, UA: NEGATIVE
Nitrite: NEGATIVE
PROTEIN: NEGATIVE mg/dL
Specific Gravity, Urine: 1.006 (ref 1.005–1.030)
UROBILINOGEN UA: 1 mg/dL (ref 0.0–1.0)
pH: 7 (ref 5.0–8.0)

## 2015-03-05 LAB — DIFFERENTIAL
BASOS PCT: 1 % (ref 0–1)
Basophils Absolute: 0 10*3/uL (ref 0.0–0.1)
Eosinophils Absolute: 0.4 10*3/uL (ref 0.0–0.7)
Eosinophils Relative: 7 % — ABNORMAL HIGH (ref 0–5)
LYMPHS PCT: 27 % (ref 12–46)
Lymphs Abs: 1.5 10*3/uL (ref 0.7–4.0)
Monocytes Absolute: 0.6 10*3/uL (ref 0.1–1.0)
Monocytes Relative: 11 % (ref 3–12)
NEUTROS PCT: 55 % (ref 43–77)
Neutro Abs: 3.1 10*3/uL (ref 1.7–7.7)

## 2015-03-05 LAB — COMPREHENSIVE METABOLIC PANEL
ALBUMIN: 3.2 g/dL — AB (ref 3.5–5.0)
ALK PHOS: 95 U/L (ref 38–126)
ALT: 10 U/L — AB (ref 17–63)
ANION GAP: 7 (ref 5–15)
AST: 18 U/L (ref 15–41)
BUN: 13 mg/dL (ref 6–20)
CALCIUM: 8.8 mg/dL — AB (ref 8.9–10.3)
CHLORIDE: 102 mmol/L (ref 101–111)
CO2: 29 mmol/L (ref 22–32)
CREATININE: 1.01 mg/dL (ref 0.61–1.24)
GFR calc Af Amer: >60 mL/min (ref >60)
GFR calc non Af Amer: >60 mL/min (ref >60)
Glucose, Bld: 150 mg/dL — ABNORMAL HIGH (ref 65–99)
POTASSIUM: 3.7 mmol/L (ref 3.5–5.1)
Sodium: 138 mmol/L (ref 135–145)
TOTAL PROTEIN: 6.3 g/dL — AB (ref 6.5–8.1)
Total Bilirubin: 0.9 mg/dL (ref 0.3–1.2)

## 2015-03-05 LAB — PROTIME-INR
INR: 1.16 (ref 0.00–1.49)
PROTHROMBIN TIME: 14.9 s (ref 11.6–15.2)

## 2015-03-05 LAB — I-STAT CHEM 8, ED
BUN: 16 mg/dL (ref 6–20)
CHLORIDE: 98 mmol/L — AB (ref 101–111)
Calcium, Ion: 1.14 mmol/L (ref 1.13–1.30)
Creatinine, Ser: 0.9 mg/dL (ref 0.61–1.24)
Glucose, Bld: 147 mg/dL — ABNORMAL HIGH (ref 65–99)
HCT: 48 % (ref 39.0–52.0)
HEMOGLOBIN: 16.3 g/dL (ref 13.0–17.0)
Potassium: 3.7 mmol/L (ref 3.5–5.1)
SODIUM: 142 mmol/L (ref 135–145)
TCO2: 28 mmol/L (ref 0–100)

## 2015-03-05 LAB — CBC
HCT: 46.1 % (ref 39.0–52.0)
Hemoglobin: 15.2 g/dL (ref 13.0–17.0)
MCH: 33 pg (ref 26.0–34.0)
MCHC: 33 g/dL (ref 30.0–36.0)
MCV: 100.2 fL — ABNORMAL HIGH (ref 78.0–100.0)
PLATELETS: 160 10*3/uL (ref 150–400)
RBC: 4.6 MIL/uL (ref 4.22–5.81)
RDW: 12.7 % (ref 11.5–15.5)
WBC: 5.7 10*3/uL (ref 4.0–10.5)

## 2015-03-05 LAB — URINE MICROSCOPIC-ADD ON

## 2015-03-05 LAB — CBG MONITORING, ED: Glucose-Capillary: 136 mg/dL — ABNORMAL HIGH (ref 65–99)

## 2015-03-05 LAB — I-STAT TROPONIN, ED: TROPONIN I, POC: 0 ng/mL (ref 0.00–0.08)

## 2015-03-05 LAB — APTT: aPTT: 27 seconds (ref 24–37)

## 2015-03-05 MED ORDER — ASPIRIN 325 MG PO TABS
325.0000 mg | ORAL_TABLET | Freq: Every day | ORAL | Status: DC
  Administered 2015-03-05: 325 mg via ORAL
  Filled 2015-03-05: qty 1

## 2015-03-05 MED ORDER — ASPIRIN 300 MG RE SUPP
300.0000 mg | Freq: Every day | RECTAL | Status: DC
  Filled 2015-03-05: qty 1

## 2015-03-05 MED ORDER — SODIUM CHLORIDE 0.9 % IJ SOLN: 3.0000 mL | INTRAMUSCULAR | Status: DC | PRN

## 2015-03-05 MED ORDER — STROKE: EARLY STAGES OF RECOVERY BOOK
Freq: Once | Status: AC
  Administered 2015-03-06: 11:00:00
  Filled 2015-03-05: qty 1

## 2015-03-05 MED ORDER — SODIUM CHLORIDE 0.9 % IJ SOLN
3.0000 mL | Freq: Two times a day (BID) | INTRAMUSCULAR | Status: DC
  Administered 2015-03-05 – 2015-03-08 (×6): 3 mL via INTRAVENOUS
  Administered 2015-03-08: 10 mL via INTRAVENOUS
  Administered 2015-03-09 (×2): 3 mL via INTRAVENOUS

## 2015-03-05 MED ORDER — LABETALOL HCL 5 MG/ML IV SOLN
5.0000 mg | Freq: Once | INTRAVENOUS | Status: AC
  Administered 2015-03-05: 5 mg via INTRAVENOUS
  Filled 2015-03-05: qty 4

## 2015-03-05 MED ORDER — SODIUM CHLORIDE 0.9 % IV SOLN: 250.0000 mL | INTRAVENOUS | Status: DC | PRN

## 2015-03-05 NOTE — ED Notes (Signed)
Pt transported to CT by this RN.

## 2015-03-05 NOTE — Progress Notes (Signed)
Pt arrived from ED, very uncomfortable, c/o need to urinate. States he had not urinated since this afternoon and has been "in misery for the last four hours". Unable to use urinal, [facility]. Pt continued to be in pain, unable to fully void (some dribbles-- pink in nature, tiny clots appreciated). Pt states he does not usually have issues urinating and generally goes every 4-5 hours; further endorses that he has not be catheterized or suffered penile trauma today. MD notified, order to one time I&O cath and obtain UA. Cath with moderate resistance, pt states he has never been told he has a problem with his prostate. Pink/tea color urine obtained, large clot passed at completion of cath. Pt reports relief at this time. Will continue to monitor.

## 2015-03-05 NOTE — Consult Note (Signed)
Stroke Consult    Chief Complaint: left sided weakness HPI: Ronald PhenesJames L Lacewell is an 81 y.o. male hx of HTN, CVA, left vertebral artery stenosis presenting with acute onset of left sided weakness involving the leg > arm. He also notes some associated paresthesias on the left side. He takes a daily ASA 81mg  and Plavix 75mg . Patients left shoulder is in a sling secondary to a humeral fracture from a fall in 12/2014.   CT head imaging reviewed shows no acute process.   Date last known well: 03/04/2015 Time last known well: 2200 tPA Given: no, outside IV tPA window  Past Medical History  Diagnosis Date  . Hypertension   . Stroke 1992  . Kidney stone     Past Surgical History  Procedure Laterality Date  . Cholecystectomy    . Cataract extraction      Family History  Problem Relation Age of Onset  . Aneurysm Brother     brain   Social History:  reports that he has been smoking Cigarettes.  He has a 40 pack-year smoking history. He does not have any smokeless tobacco history on file. He reports that he does not drink alcohol. His drug history is not on file.  Allergies: No Known Allergies   (Not in a hospital admission)  ROS: Out of a complete 14 system review, the patient complains of only the following symptoms, and all other reviewed systems are negative. +weakness   Physical Examination: Filed Vitals:   03/05/15 1955  BP: 177/115  Pulse: 106  Temp:   Resp:    Physical Exam  Constitutional: He appears well-developed and well-nourished.  Psych: Affect appropriate to situation Eyes: No scleral injection HENT: No OP obstrucion Head: Normocephalic.  Cardiovascular: Normal rate and regular rhythm.  Respiratory: Effort normal and breath sounds normal.  GI: Soft. Bowel sounds are normal. No distension. There is no tenderness.  Skin: WDI   Neurologic Examination: Mental Status: Alert, oriented, thought content appropriate.  Speech fluent without evidence of aphasia.   Able to follow 3 step commands without difficulty. Cranial Nerves: II: funduscopic exam wnl bilaterally, visual fields grossly normal, pupils equal, round, reactive to light and accommodation III,IV, VI: ptosis not present, extra-ocular motions intact bilaterally V,VII: smile symmetric, facial light touch sensation normal bilaterally VIII: hearing normal bilaterally IX,X: gag reflex present XI: trapezius strength/neck flexion strength normal bilaterally XII: tongue strength normal  Motor: RUE and RLE 5/5 strength Proximal LUE limited due to arm in sling and recent fracture. 4/5 grip strength on the left Proximal LLE 3//5, distal 4/5 Tone and bulk:normal tone throughout; no atrophy noted Sensory: decreased LT on left side Deep Tendon Reflexes: did not test left biceps or triceps but otherwise 1+ throughout Plantars: Right: downgoing   Left: downgoing Cerebellar: Normal FTN on the right (unable to test left), unable to test HTS on left Gait: deferred  Laboratory Studies:   Basic Metabolic Panel:  Recent Labs Lab 03/05/15 1631 03/05/15 1639  NA 138 142  K 3.7 3.7  CL 102 98*  CO2 29  --   GLUCOSE 150* 147*  BUN 13 16  CREATININE 1.01 0.90  CALCIUM 8.8*  --     Liver Function Tests:  Recent Labs Lab 03/05/15 1631  AST 18  ALT 10*  ALKPHOS 95  BILITOT 0.9  PROT 6.3*  ALBUMIN 3.2*   No results for input(s): LIPASE, AMYLASE in the last 168 hours. No results for input(s): AMMONIA in the last 168 hours.  CBC:  Recent Labs Lab 03/05/15 1631 03/05/15 1639  WBC 5.7  --   NEUTROABS 3.1  --   HGB 15.2 16.3  HCT 46.1 48.0  MCV 100.2*  --   PLT 160  --     Cardiac Enzymes: No results for input(s): CKTOTAL, CKMB, CKMBINDEX, TROPONINI in the last 168 hours.  BNP: Invalid input(s): POCBNP  CBG:  Recent Labs Lab 03/05/15 1623  GLUCAP 136*    Microbiology: No results found for this or any previous visit.  Coagulation Studies:  Recent Labs   03/05/15 1631  LABPROT 14.9  INR 1.16    Urinalysis: No results for input(s): COLORURINE, LABSPEC, PHURINE, GLUCOSEU, HGBUR, BILIRUBINUR, KETONESUR, PROTEINUR, UROBILINOGEN, NITRITE, LEUKOCYTESUR in the last 168 hours.  Invalid input(s): APPERANCEUR  Lipid Panel:     Component Value Date/Time   CHOL 151 04/26/2010 1049   TRIG 161.0* 04/26/2010 1049   HDL 42.90 04/26/2010 1049   CHOLHDL 4 04/26/2010 1049   VLDL 32.2 04/26/2010 1049   LDLCALC 76 04/26/2010 1049    HgbA1C: No results found for: HGBA1C  Urine Drug Screen:  No results found for: LABOPIA, COCAINSCRNUR, LABBENZ, AMPHETMU, THCU, LABBARB  Alcohol Level: No results for input(s): ETH in the last 168 hours.  Other results:  Imaging: Ct Head Wo Contrast  03/05/2015   CLINICAL DATA:  Code stroke. Acute onset of left-sided weakness. Recent fall. Initial encounter.  EXAM: CT HEAD WITHOUT CONTRAST  TECHNIQUE: Contiguous axial images were obtained from the base of the skull through the vertex without intravenous contrast.  COMPARISON:  CT of the head performed 01/12/2009  FINDINGS: There is no evidence of acute infarction, mass lesion, or intra- or extra-axial hemorrhage on CT.  Prominence of the ventricles and sulci reflects mild cortical volume loss. Cerebellar atrophy is noted. Scattered periventricular white matter change likely reflects small vessel ischemic microangiopathy. A chronic lacunar infarct is noted at the left cerebellar hemisphere. Chronic ischemic change is noted at the basal ganglia and thalami bilaterally.  The brainstem and fourth ventricle are within normal limits. The cerebral hemispheres demonstrate grossly normal gray-white differentiation. No mass effect or midline shift is seen.  There is no evidence of fracture; scattered nonspecific lucencies within the visualized osseous structures appear grossly stable from 2010. The visualized portions of the orbits are within normal limits. The patient is status post  bilateral maxillary antrostomy. Mild mucosal thickening is noted at the right maxillary sinus. There is opacification of the right frontal sinus, and partial opacification of the mastoid air cells bilaterally. The remaining paranasal sinuses are well-aerated. No significant soft tissue abnormalities are seen.  IMPRESSION: 1. No acute intracranial pathology seen on CT. 2. Mild cortical volume loss and scattered small vessel ischemic microangiopathy. 3. Chronic ischemic change at the basal ganglia and thalami bilaterally. Chronic lacunar infarct at the left cerebellar hemisphere. 4. Mild mucosal thickening at the right maxillary sinus, opacification of the right frontal sinus, and partial opacification of the mastoid air cells bilaterally.   Electronically Signed   By: Roanna RaiderJeffery  Chang M.D.   On: 03/05/2015 17:27    Assessment: 81 y.o. male hx of HTN, prior CVA presenting after waking up with left sided weakness leg > arm. Exam shows left sided weakness and sensory deficits. Admitted for stroke workup.   Plan: 1. HgbA1c, fasting lipid panel 2. MRI, MRA  of the brain without contrast 3. PT consult, OT consult, Speech consult 4. Echocardiogram 5. Carotid dopplers 6. Prophylactic therapy-continue ASA and Plavix 7.  Risk factor modification 8. Telemetry monitoring 9. Frequent neuro checks 10. NPO until RN stroke swallow screen    Elspeth Cho, DO [facility] [phone]  If 7pm- 7am, please page neurology on call as listed in AMION. 03/05/2015, 8:50 PM

## 2015-03-05 NOTE — ED Notes (Signed)
Per EMS: pt from home for eval of generalized weakness that has been increasing since pt broke his humerous. Pt reports decrease in activity, EMS noted no neuro deficits upon arrival and pt alert and oriented x4 upon arrival. EMS noted some delayed response to movement of left arm but this is affected extremity. nad noted.

## 2015-03-05 NOTE — ED Provider Notes (Signed)
CSN: [id number]     Arrival date & time 03/05/15  1609 History   First MD Initiated Contact with Patient 03/05/15 1612     Chief Complaint  Patient presents with  . Weakness     (Consider location/radiation/quality/duration/timing/severity/associated sxs/prior Treatment) HPI  Pt presenting with c/o weakness in left lower extremity which began when he awoke from sleep this morning.  This weakness has continued and then at some point- pt tells me approx 10am he began to feel that his left arm was weak.  He had humerus fracture approx 8 weeks ago which is healing and patient states his left arm has been becoming progressively weaker over that time.  He takes plavix due to prior stroke.  No headache.  No changes in vision or speech.  No fainting.  Symptoms have been continuous. There are no other associated systemic symptoms, there are no other alleviating or modifying factors.   Past Medical History  Diagnosis Date  . Hypertension   . Stroke 1992  . Kidney stone    Past Surgical History  Procedure Laterality Date  . Cholecystectomy    . Cataract extraction     Family History  Problem Relation Age of Onset  . Aneurysm Brother     brain   History  Substance Use Topics  . Smoking status: Current Some Day Smoker -- 0.50 packs/day for 80 years    Types: Cigarettes  . Smokeless tobacco: Not on file  . Alcohol Use: No    Review of Systems  ROS reviewed and all otherwise negative except for mentioned in HPI    Allergies  Review of patient's allergies indicates no known allergies.  Home Medications   Prior to Admission medications   Medication Sig Start Date End Date Taking? Authorizing Provider  aspirin EC 81 MG tablet Take 81 mg by mouth daily as needed (pain/headache).   Yes Historical Provider, MD  Calcium-Magnesium-Vitamin D (CALCIUM 1200+D3 PO) Take 1 tablet by mouth daily.   Yes Historical Provider, MD  clopidogrel (PLAVIX) 75 MG tablet TAKE ONE TABLET BY MOUTH ONCE  DAILY 11/11/14  Yes Kristian CoveyBruce W Burchette, MD  losartan-hydrochlorothiazide (HYZAAR) 50-12.5 MG per tablet Take 1 tablet by mouth daily. 06/03/14  Yes Kristian CoveyBruce W Burchette, MD  ibuprofen (ADVIL,MOTRIN) 400 MG tablet Take 1 tablet (400 mg total) by mouth every 6 (six) hours as needed. Patient not taking: Reported on 03/05/2015 01/07/15   Eyvonne MechanicJeffrey Hedges, PA-C  oxyCODONE-acetaminophen (PERCOCET/ROXICET) 5-325 MG per tablet Take 1 tablet by mouth every 6 (six) hours as needed for severe pain. Patient not taking: Reported on 03/05/2015 01/07/15   Eyvonne MechanicJeffrey Hedges, PA-C   BP 143/82 mmHg  Pulse 93  Temp(Src) 98.1 F (36.7 C) (Axillary)  Resp 27  Ht 5\' 10"  (1.778 m)  Wt 158 lb 4.6 oz (71.8 kg)  BMI 22.71 kg/m2  SpO2 91%  Vitals reviewed Physical Exam  Physical Examination: General appearance - alert, well appearing, and in no distress Mental status - alert, oriented to person, place, and time Eyes - PERRL, EOMI Mouth - mucous membranes moist, pharynx normal without lesions Chest - clear to auscultation, no wheezes, rales or rhonchi, symmetric air entry Heart - normal rate, regular rhythm, normal S1, S2, no murmurs, rubs, clicks or gallops Abdomen - soft, nontender, nondistended, no masses or organomegaly Neurological - alert, oriented x 3, cranial nerves 2-12 tested and intact, left upper extremity with pronator drift, normal grip strength, left lower extremity able to only lift slightly against gravity Extremities -  peripheral pulses normal, no pedal edema, no clubbing or cyanosis Skin - normal coloration and turgor, no rashes  ED Course  Procedures (including critical care time)  4:51 PM pt not a candidate for TPA, it took some time to illicit the history from the patient, but eventually the time frame seems to be he awoke with left lower extremity weakness, cannot recall exactly the time of his onset of arm symptoms- but sometime before noon today.  Pt also takes plavix daily.  This exculdes him in tems  of delayed presentaion, unclear time of onset- relative contraindications age > 26, taking plavix.  Marland Kitchen    7:06 PM d/w [facility] for admission, Dr. Selena Batten.  Also d/w Dr. Vaughan Basta, neurology- they will consult on patient.  Labs Review Labs Reviewed  CBC - Abnormal; Notable for the following:    MCV 100.2 (*)    All other components within normal limits  DIFFERENTIAL - Abnormal; Notable for the following:    Eosinophils Relative 7 (*)    All other components within normal limits  COMPREHENSIVE METABOLIC PANEL - Abnormal; Notable for the following:    Glucose, Bld 150 (*)    Calcium 8.8 (*)    Total Protein 6.3 (*)    Albumin 3.2 (*)    ALT 10 (*)    All other components within normal limits  TROPONIN I - Abnormal; Notable for the following:    Troponin I 0.70 (*)    All other components within normal limits  TROPONIN I - Abnormal; Notable for the following:    Troponin I 1.61 (*)    All other components within normal limits  TROPONIN I - Abnormal; Notable for the following:    Troponin I 1.38 (*)    All other components within normal limits  COMPREHENSIVE METABOLIC PANEL - Abnormal; Notable for the following:    Glucose, Bld 134 (*)    Total Protein 6.1 (*)    Albumin 3.1 (*)    ALT 9 (*)    GFR calc non Af Amer 55 (*)    All other components within normal limits  URINALYSIS, ROUTINE W REFLEX MICROSCOPIC - Abnormal; Notable for the following:    APPearance CLOUDY (*)    Hgb urine dipstick LARGE (*)    All other components within normal limits  CK TOTAL AND CKMB - Abnormal; Notable for the following:    CK, MB 12.1 (*)    Relative Index 11.4 (*)    All other components within normal limits  I-STAT CHEM 8, ED - Abnormal; Notable for the following:    Chloride 98 (*)    Glucose, Bld 147 (*)    All other components within normal limits  CBG MONITORING, ED - Abnormal; Notable for the following:    Glucose-Capillary 136 (*)    All other components within normal limits  MRSA PCR  SCREENING  PROTIME-INR  APTT  TSH  VITAMIN B12  SEDIMENTATION RATE  URINE MICROSCOPIC-ADD ON  LIPID PANEL  HEMOGLOBIN A1C  CBC  BASIC METABOLIC PANEL  I-STAT TROPOININ, ED    Imaging Review Dg Chest 1 View  03/06/2015   CLINICAL DATA:  Acute onset of left leg weakness. Initial encounter.  EXAM: CHEST  1 VIEW  COMPARISON:  Chest radiograph performed 05/03/2014, and CT of the chest performed 05/06/2014  FINDINGS: The lungs are well-aerated. Vascular congestion is noted. Mildly increased interstitial markings may reflect mild interstitial edema. Mild left basilar atelectasis is noted. There is no evidence of  pleural effusion or pneumothorax.  The cardiomediastinal silhouette is mildly enlarged. A significantly displaced comminuted fracture of the left proximal humeral diaphysis is noted, with approximately 1 shaft width medial displacement of the distal humerus.  IMPRESSION: 1. Vascular congestion and mild cardiomegaly. Mildly increased interstitial markings may reflect mild interstitial edema. Mild left basilar atelectasis noted. 2. Significantly displaced comminuted fracture of the left proximal humeral diaphysis, with approximately 1 shaft width medial displacement of the distal humerus.   Electronically Signed   By: Roanna Raider M.D.   On: 03/06/2015 02:49   Ct Head Wo Contrast  03/05/2015   CLINICAL DATA:  Code stroke. Acute onset of left-sided weakness. Recent fall. Initial encounter.  EXAM: CT HEAD WITHOUT CONTRAST  TECHNIQUE: Contiguous axial images were obtained from the base of the skull through the vertex without intravenous contrast.  COMPARISON:  CT of the head performed 01/12/2009  FINDINGS: There is no evidence of acute infarction, mass lesion, or intra- or extra-axial hemorrhage on CT.  Prominence of the ventricles and sulci reflects mild cortical volume loss. Cerebellar atrophy is noted. Scattered periventricular white matter change likely reflects small vessel ischemic  microangiopathy. A chronic lacunar infarct is noted at the left cerebellar hemisphere. Chronic ischemic change is noted at the basal ganglia and thalami bilaterally.  The brainstem and fourth ventricle are within normal limits. The cerebral hemispheres demonstrate grossly normal gray-white differentiation. No mass effect or midline shift is seen.  There is no evidence of fracture; scattered nonspecific lucencies within the visualized osseous structures appear grossly stable from 2010. The visualized portions of the orbits are within normal limits. The patient is status post bilateral maxillary antrostomy. Mild mucosal thickening is noted at the right maxillary sinus. There is opacification of the right frontal sinus, and partial opacification of the mastoid air cells bilaterally. The remaining paranasal sinuses are well-aerated. No significant soft tissue abnormalities are seen.  IMPRESSION: 1. No acute intracranial pathology seen on CT. 2. Mild cortical volume loss and scattered small vessel ischemic microangiopathy. 3. Chronic ischemic change at the basal ganglia and thalami bilaterally. Chronic lacunar infarct at the left cerebellar hemisphere. 4. Mild mucosal thickening at the right maxillary sinus, opacification of the right frontal sinus, and partial opacification of the mastoid air cells bilaterally.   Electronically Signed   By: Roanna Raider M.D.   On: 03/05/2015 17:27   Mr Brain Wo Contrast  03/06/2015   CLINICAL DATA:  Acute onset LEFT-sided weakness, paresthesias. History of dementia, hypertension and stroke. Known LEFT vertebral artery stenosis.  EXAM: MRI HEAD WITHOUT CONTRAST  MRA HEAD WITHOUT CONTRAST  TECHNIQUE: Multiplanar, multiecho pulse sequences of the brain and surrounding structures were obtained without intravenous contrast. Angiographic images of the head were obtained using MRA technique without contrast.  COMPARISON:  CT of the head Mar 05, 2015  FINDINGS: MRI HEAD FINDINGS  11 mm  area patchy reduced diffusion within RIGHT posterior frontal lobe, precentral gyrus. Corresponding low ADC value. Minimal T2 hyperintense signal.  A few punctate foci of susceptibility artifact in the periphery of the cerebellum and, subcentimeter focus of susceptibility artifact in LEFT thalamus. Ventricles and sulci are normal for patient's age. Remote LEFT thalamus, bilateral basal ganglia lacunar infarcts. Remote small LEFT cerebellar infarct. Patchy to confluent supratentorial white matter FLAIR T2 hyperintensities. No midline shift, mass effect or mass lesions. Multiple tiny perivascular spaces in the basal ganglia.  No abnormal extra-axial fluid collections. Bilateral ocular lens implant. Soft tissue opacifies the RIGHT frontal sinus, sequelae  of chronic sinusitis, status post FESS. Small bilateral mastoid effusions. No abnormal sellar expansion. No cerebellar tonsillar ectopia. No suspicious calvarial bone marrow signal. Patient appears edentulous.  MRA HEAD FINDINGS (decreased signal to noise ratio with the skullbase may be related to motion)  Anterior circulation: Normal flow related enhancement of the cervical, petrous, cavernous and supra clinoid internal carotid arteries bilaterally. Anterior communicating arteries is patent. Normal flow related enhancement of the anterior and middle cerebral arteries.  Posterior circulation: Low signal to noise ratio. Limited assessment for subtle vertebral artery luminal regularity. RIGHT vertebral artery appears dominant, flow related enhancement within the basilar artery, which is widely patent. Gradual tapering of the distal LEFT V4 segment. Robust bilateral posterior communicating arteries contributing to the posterior circulation with flow related enhancement within the bilateral posterior cerebral arteries.  No high-grade stenosis, aneurysm, suspicious luminal irregularity  IMPRESSION: MRI HEAD: Small area of acute ischemia within RIGHT posterior frontal lobe  (middle cerebral artery territory).  Involutional changes. Moderate to severe white matter changes suggest chronic small vessel ischemic disease.  Remote LEFT thalamic hemorrhagic infarct. Bilateral basal ganglia lacunar infarcts.  MRA HEAD: Generally decreased signal to noise ratio limits evaluation without large vessel occlusion. Possible stenosis of the distal LEFT V4 segment versus artifact.   Electronically Signed   By: Awilda Metroourtnay  Bloomer   On: 03/06/2015 03:02   Mr Maxine GlennMra Head/brain Wo Cm  03/06/2015   CLINICAL DATA:  Acute onset LEFT-sided weakness, paresthesias. History of dementia, hypertension and stroke. Known LEFT vertebral artery stenosis.  EXAM: MRI HEAD WITHOUT CONTRAST  MRA HEAD WITHOUT CONTRAST  TECHNIQUE: Multiplanar, multiecho pulse sequences of the brain and surrounding structures were obtained without intravenous contrast. Angiographic images of the head were obtained using MRA technique without contrast.  COMPARISON:  CT of the head Mar 05, 2015  FINDINGS: MRI HEAD FINDINGS  11 mm area patchy reduced diffusion within RIGHT posterior frontal lobe, precentral gyrus. Corresponding low ADC value. Minimal T2 hyperintense signal.  A few punctate foci of susceptibility artifact in the periphery of the cerebellum and, subcentimeter focus of susceptibility artifact in LEFT thalamus. Ventricles and sulci are normal for patient's age. Remote LEFT thalamus, bilateral basal ganglia lacunar infarcts. Remote small LEFT cerebellar infarct. Patchy to confluent supratentorial white matter FLAIR T2 hyperintensities. No midline shift, mass effect or mass lesions. Multiple tiny perivascular spaces in the basal ganglia.  No abnormal extra-axial fluid collections. Bilateral ocular lens implant. Soft tissue opacifies the RIGHT frontal sinus, sequelae of chronic sinusitis, status post FESS. Small bilateral mastoid effusions. No abnormal sellar expansion. No cerebellar tonsillar ectopia. No suspicious calvarial bone  marrow signal. Patient appears edentulous.  MRA HEAD FINDINGS (decreased signal to noise ratio with the skullbase may be related to motion)  Anterior circulation: Normal flow related enhancement of the cervical, petrous, cavernous and supra clinoid internal carotid arteries bilaterally. Anterior communicating arteries is patent. Normal flow related enhancement of the anterior and middle cerebral arteries.  Posterior circulation: Low signal to noise ratio. Limited assessment for subtle vertebral artery luminal regularity. RIGHT vertebral artery appears dominant, flow related enhancement within the basilar artery, which is widely patent. Gradual tapering of the distal LEFT V4 segment. Robust bilateral posterior communicating arteries contributing to the posterior circulation with flow related enhancement within the bilateral posterior cerebral arteries.  No high-grade stenosis, aneurysm, suspicious luminal irregularity  IMPRESSION: MRI HEAD: Small area of acute ischemia within RIGHT posterior frontal lobe (middle cerebral artery territory).  Involutional changes. Moderate to severe white matter  changes suggest chronic small vessel ischemic disease.  Remote LEFT thalamic hemorrhagic infarct. Bilateral basal ganglia lacunar infarcts.  MRA HEAD: Generally decreased signal to noise ratio limits evaluation without large vessel occlusion. Possible stenosis of the distal LEFT V4 segment versus artifact.   Electronically Signed   By: Awilda Metro   On: 03/06/2015 03:02     EKG Interpretation   Date/Time:  Saturday Mar 05 2015 16:20:17 EDT Ventricular Rate:  74 PR Interval:  222 QRS Duration: 136 QT Interval:  428 QTC Calculation: 475 R Axis:   111 Text Interpretation:  Sinus or ectopic atrial rhythm Atrial premature  complexes Borderline prolonged PR interval RBBB and LPFB No significant  change since last tracing Confirmed by [facility]  MD, Sherae Santino [phone]) on  03/05/2015 6:02:43 PM      MDM   Final  diagnoses:  Left leg weakness  CVA  Pt presenting with c/o left leg weakness present upon awakening this morning.  Left upper extrmeity with weakness as well beginning later in the morning.  Head CT reassuring.  Stroke workup intiiated, but patient not TPA candidate as noted above.  Pt admitted to [facility] for further workup.      Jerelyn Scott, MD 03/06/15 2035

## 2015-03-05 NOTE — H&P (Signed)
Ronald [name] is an 81 y.o. male.    Dr. Elease Hashimoto (pcp)  Chief Complaint: left leg weakness HPI: 81 yo male with htn, cva, left vertebral artery stenosis on prior MRA apparently c/o left leg weakness starting this am.  Pt has had some numbness, tingling ?Marland Kitchen  Pt daughter thinks that he has baseline unsteadiness.  Pt was brought to ED for evaluation and CT brain negative for acute process. Pt will be admitted for r/o CVA.    Past Medical History  Diagnosis Date  . Hypertension   . Stroke 1992  . Kidney stone     Past Surgical History  Procedure Laterality Date  . Cholecystectomy    . Cataract extraction      Family History  Problem Relation Age of Onset  . Aneurysm Brother     brain   Social History:  reports that he has been smoking Cigarettes.  He has a 40 pack-year smoking history. He does not have any smokeless tobacco history on file. He reports that he does not drink alcohol. His drug history is not on file.  Allergies: No Known Allergies Medications reviewed  (Not in a hospital admission)  Results for orders placed or performed during the hospital encounter of 03/05/15 (from the past 48 hour(s))  CBG monitoring, ED     Status: Abnormal   Collection Time: 03/05/15  4:23 PM  Result Value Ref Range   Glucose-Capillary 136 (H) 65 - 99 mg/dL  Protime-INR     Status: None   Collection Time: 03/05/15  4:31 PM  Result Value Ref Range   Prothrombin Time 14.9 11.6 - 15.2 seconds   INR 1.16 0.00 - 1.49  APTT     Status: None   Collection Time: 03/05/15  4:31 PM  Result Value Ref Range   aPTT 27 24 - 37 seconds  CBC     Status: Abnormal   Collection Time: 03/05/15  4:31 PM  Result Value Ref Range   WBC 5.7 4.0 - 10.5 K/uL   RBC 4.60 4.22 - 5.81 MIL/uL   Hemoglobin 15.2 13.0 - 17.0 g/dL   HCT 46.1 39.0 - 52.0 %   MCV 100.2 (H) 78.0 - 100.0 fL   MCH 33.0 26.0 - 34.0 pg   MCHC 33.0 30.0 - 36.0 g/dL   RDW 12.7 11.5 - 15.5 %   Platelets 160 150 - 400 K/uL  Differential      Status: Abnormal   Collection Time: 03/05/15  4:31 PM  Result Value Ref Range   Neutrophils Relative % 55 43 - 77 %   Neutro Abs 3.1 1.7 - 7.7 K/uL   Lymphocytes Relative 27 12 - 46 %   Lymphs Abs 1.5 0.7 - 4.0 K/uL   Monocytes Relative 11 3 - 12 %   Monocytes Absolute 0.6 0.1 - 1.0 K/uL   Eosinophils Relative 7 (H) 0 - 5 %   Eosinophils Absolute 0.4 0.0 - 0.7 K/uL   Basophils Relative 1 0 - 1 %   Basophils Absolute 0.0 0.0 - 0.1 K/uL  Comprehensive metabolic panel     Status: Abnormal   Collection Time: 03/05/15  4:31 PM  Result Value Ref Range   Sodium 138 135 - 145 mmol/L   Potassium 3.7 3.5 - 5.1 mmol/L   Chloride 102 101 - 111 mmol/L   CO2 29 22 - 32 mmol/L   Glucose, Bld 150 (H) 65 - 99 mg/dL   BUN 13 6 - 20 mg/dL  Creatinine, Ser 1.01 0.61 - 1.24 mg/dL   Calcium 8.8 (L) 8.9 - 10.3 mg/dL   Total Protein 6.3 (L) 6.5 - 8.1 g/dL   Albumin 3.2 (L) 3.5 - 5.0 g/dL   AST 18 15 - 41 U/L   ALT 10 (L) 17 - 63 U/L   Alkaline Phosphatase 95 38 - 126 U/L   Total Bilirubin 0.9 0.3 - 1.2 mg/dL   GFR calc non Af Amer >60 >60 mL/min   GFR calc Af Amer >60 >60 mL/min    Comment: (NOTE) The eGFR has been calculated using the CKD EPI equation. This calculation has not been validated in all clinical situations. eGFR's persistently <60 mL/min signify possible Chronic Kidney Disease.    Anion gap 7 5 - 15  I-stat troponin, ED (not at [facility], [facility])     Status: None   Collection Time: 03/05/15  4:37 PM  Result Value Ref Range   Troponin i, poc 0.00 0.00 - 0.08 ng/mL   Comment 3            Comment: Due to the release kinetics of cTnI, a negative result within the first hours of the onset of symptoms does not rule out myocardial infarction with certainty. If myocardial infarction is still suspected, repeat the test at appropriate intervals.   I-Stat Chem 8, ED  (not at [facility], [facility])     Status: Abnormal   Collection Time: 03/05/15  4:39 PM  Result Value Ref Range   Sodium 142 135 - 145  mmol/L   Potassium 3.7 3.5 - 5.1 mmol/L   Chloride 98 (L) 101 - 111 mmol/L   BUN 16 6 - 20 mg/dL   Creatinine, Ser 0.90 0.61 - 1.24 mg/dL   Glucose, Bld 147 (H) 65 - 99 mg/dL   Calcium, Ion 1.14 1.13 - 1.30 mmol/L   TCO2 28 0 - 100 mmol/L   Hemoglobin 16.3 13.0 - 17.0 g/dL   HCT 48.0 39.0 - 52.0 %   Ct Head Wo Contrast  03/05/2015   CLINICAL DATA:  Code stroke. Acute onset of left-sided weakness. Recent fall. Initial encounter.  EXAM: CT HEAD WITHOUT CONTRAST  TECHNIQUE: Contiguous axial images were obtained from the base of the skull through the vertex without intravenous contrast.  COMPARISON:  CT of the head performed 01/12/2009  FINDINGS: There is no evidence of acute infarction, mass lesion, or intra- or extra-axial hemorrhage on CT.  Prominence of the ventricles and sulci reflects mild cortical volume loss. Cerebellar atrophy is noted. Scattered periventricular white matter change likely reflects small vessel ischemic microangiopathy. A chronic lacunar infarct is noted at the left cerebellar hemisphere. Chronic ischemic change is noted at the basal ganglia and thalami bilaterally.  The brainstem and fourth ventricle are within normal limits. The cerebral hemispheres demonstrate grossly normal gray-white differentiation. No mass effect or midline shift is seen.  There is no evidence of fracture; scattered nonspecific lucencies within the visualized osseous structures appear grossly stable from 2010. The visualized portions of the orbits are within normal limits. The patient is status post bilateral maxillary antrostomy. Mild mucosal thickening is noted at the right maxillary sinus. There is opacification of the right frontal sinus, and partial opacification of the mastoid air cells bilaterally. The remaining paranasal sinuses are well-aerated. No significant soft tissue abnormalities are seen.  IMPRESSION: 1. No acute intracranial pathology seen on CT. 2. Mild cortical volume loss and scattered  small vessel ischemic microangiopathy. 3. Chronic ischemic change at the basal ganglia  and thalami bilaterally. Chronic lacunar infarct at the left cerebellar hemisphere. 4. Mild mucosal thickening at the right maxillary sinus, opacification of the right frontal sinus, and partial opacification of the mastoid air cells bilaterally.   Electronically Signed   By: Garald Balding M.D.   On: 03/05/2015 17:27    Review of Systems  Constitutional: Negative.   HENT: Negative.   Eyes: Negative.   Respiratory: Negative.   Cardiovascular: Negative.   Gastrointestinal: Negative.   Genitourinary: Negative.   Musculoskeletal: Negative.   Skin: Negative.   Neurological: Positive for tingling, tremors, sensory change and focal weakness. Negative for dizziness, speech change, seizures and loss of consciousness.  Endo/Heme/Allergies: Negative.   Psychiatric/Behavioral: Negative.     Blood pressure 184/70, pulse 74, temperature 97.7 F (36.5 C), temperature source Oral, resp. rate 24, height 5' 11"  (1.803 m), weight 70.761 kg (156 lb), SpO2 94 %. Physical Exam  Constitutional: He is oriented to person, place, and time. He appears well-developed and well-nourished.  HENT:  Head: Normocephalic and atraumatic.  Mouth/Throat: No oropharyngeal exudate.  Eyes: Conjunctivae and EOM are normal. Pupils are equal, round, and reactive to light. No scleral icterus.  Neck: Normal range of motion. Neck supple. No JVD present. No tracheal deviation present. No thyromegaly present.  Cardiovascular: Normal rate and regular rhythm.  Exam reveals no gallop and no friction rub.   No murmur heard. Respiratory: Effort normal and breath sounds normal. No respiratory distress. He has no wheezes. He has no rales.  GI: Soft. Bowel sounds are normal. He exhibits no distension. There is no tenderness. There is no rebound and no guarding.  Musculoskeletal: Normal range of motion. He exhibits no edema or tenderness.   Lymphadenopathy:    He has no cervical adenopathy.  Neurological: He is alert and oriented to person, place, and time. He has normal reflexes. He displays normal reflexes. No cranial nerve deficit. He exhibits normal muscle tone. Coordination normal.  5-/5 left leg motor strength  Skin: Skin is warm and dry. No rash noted. No erythema. No pallor.  Psychiatric: He has a normal mood and affect. His behavior is normal. Judgment and thought content normal.     Assessment/Plan Left leg weakness r/o CVA Aspirin, statin MRI brain Carotid u/s Cardiac echo Neurology consultation requested by ED. We appreciate their input.   Hyperglycemia Check hga1c  Tachycardia Check trop i q6h x3 Check tsh ekg stat 12 lead  Hypertension uncontrolled Permissive hypertension Labetalol 66m iv x1  DVT prophylaxis: scd   KHOLBERT, CAPLES5/14/2016, 7:09 PM

## 2015-03-06 ENCOUNTER — Inpatient Hospital Stay (HOSPITAL_COMMUNITY): Payer: Medicare Other

## 2015-03-06 DIAGNOSIS — R739 Hyperglycemia, unspecified: Secondary | ICD-10-CM

## 2015-03-06 DIAGNOSIS — S42302G Unspecified fracture of shaft of humerus, left arm, subsequent encounter for fracture with delayed healing: Secondary | ICD-10-CM

## 2015-03-06 DIAGNOSIS — I509 Heart failure, unspecified: Secondary | ICD-10-CM

## 2015-03-06 DIAGNOSIS — I639 Cerebral infarction, unspecified: Principal | ICD-10-CM

## 2015-03-06 DIAGNOSIS — S42302A Unspecified fracture of shaft of humerus, left arm, initial encounter for closed fracture: Secondary | ICD-10-CM | POA: Diagnosis present

## 2015-03-06 DIAGNOSIS — R29898 Other symptoms and signs involving the musculoskeletal system: Secondary | ICD-10-CM | POA: Diagnosis present

## 2015-03-06 LAB — COMPREHENSIVE METABOLIC PANEL
ALT: 9 U/L — AB (ref 17–63)
AST: 24 U/L (ref 15–41)
Albumin: 3.1 g/dL — ABNORMAL LOW (ref 3.5–5.0)
Alkaline Phosphatase: 100 U/L (ref 38–126)
Anion gap: 11 (ref 5–15)
BUN: 14 mg/dL (ref 6–20)
CO2: 28 mmol/L (ref 22–32)
CREATININE: 1.12 mg/dL (ref 0.61–1.24)
Calcium: 9.2 mg/dL (ref 8.9–10.3)
Chloride: 102 mmol/L (ref 101–111)
GFR calc Af Amer: >60 mL/min (ref >60)
GFR calc non Af Amer: 55 mL/min — ABNORMAL LOW (ref >60)
GLUCOSE: 134 mg/dL — AB (ref 65–99)
POTASSIUM: 3.7 mmol/L (ref 3.5–5.1)
Sodium: 141 mmol/L (ref 135–145)
Total Bilirubin: 1.1 mg/dL (ref 0.3–1.2)
Total Protein: 6.1 g/dL — ABNORMAL LOW (ref 6.5–8.1)

## 2015-03-06 LAB — CK TOTAL AND CKMB (NOT AT ARMC)
CK, MB: 12.1 ng/mL — ABNORMAL HIGH (ref 0.5–5.0)
Relative Index: 11.4 — ABNORMAL HIGH (ref 0.0–2.5)
Total CK: 106 U/L (ref 49–397)

## 2015-03-06 LAB — LIPID PANEL
CHOL/HDL RATIO: 2.4 ratio
Cholesterol: 116 mg/dL (ref 0–200)
HDL: 48 mg/dL (ref >40)
LDL Cholesterol: 58 mg/dL (ref 0–99)
TRIGLYCERIDES: 48 mg/dL (ref <150)
VLDL: 10 mg/dL (ref 0–40)

## 2015-03-06 LAB — VITAMIN B12: Vitamin B-12: 203 pg/mL (ref 180–914)

## 2015-03-06 LAB — TROPONIN I
TROPONIN I: 0.7 ng/mL — AB (ref <0.031)
TROPONIN I: 1.61 ng/mL — AB (ref <0.031)
Troponin I: 1.38 ng/mL (ref <0.031)

## 2015-03-06 LAB — SEDIMENTATION RATE: SED RATE: 1 mm/h (ref 0–16)

## 2015-03-06 LAB — TSH: TSH: 3.852 u[IU]/mL (ref 0.350–4.500)

## 2015-03-06 LAB — MRSA PCR SCREENING: MRSA by PCR: NEGATIVE

## 2015-03-06 MED ORDER — HYDROCHLOROTHIAZIDE 12.5 MG PO CAPS
12.5000 mg | ORAL_CAPSULE | Freq: Every day | ORAL | Status: DC
  Administered 2015-03-06 – 2015-03-08 (×2): 12.5 mg via ORAL
  Filled 2015-03-06 (×4): qty 1

## 2015-03-06 MED ORDER — CLOPIDOGREL BISULFATE 75 MG PO TABS
75.0000 mg | ORAL_TABLET | Freq: Every day | ORAL | Status: DC
  Administered 2015-03-06 – 2015-03-10 (×5): 75 mg via ORAL
  Filled 2015-03-06 (×5): qty 1

## 2015-03-06 MED ORDER — HEPARIN SODIUM (PORCINE) 5000 UNIT/ML IJ SOLN
5000.0000 [IU] | Freq: Three times a day (TID) | INTRAMUSCULAR | Status: DC
  Administered 2015-03-06 – 2015-03-10 (×12): 5000 [IU] via SUBCUTANEOUS
  Filled 2015-03-06 (×11): qty 1

## 2015-03-06 MED ORDER — ENOXAPARIN SODIUM 80 MG/0.8ML ~~LOC~~ SOLN: 1.0000 mg/kg | SUBCUTANEOUS | Status: DC

## 2015-03-06 MED ORDER — ASPIRIN EC 81 MG PO TBEC: 81.0000 mg | DELAYED_RELEASE_TABLET | Freq: Every day | ORAL | Status: DC | PRN

## 2015-03-06 MED ORDER — METOPROLOL TARTRATE 12.5 MG HALF TABLET
12.5000 mg | ORAL_TABLET | Freq: Two times a day (BID) | ORAL | Status: DC
  Administered 2015-03-06 – 2015-03-09 (×7): 12.5 mg via ORAL
  Filled 2015-03-06 (×10): qty 1

## 2015-03-06 MED ORDER — LOSARTAN POTASSIUM 50 MG PO TABS
50.0000 mg | ORAL_TABLET | Freq: Every day | ORAL | Status: DC
  Administered 2015-03-06 – 2015-03-10 (×5): 50 mg via ORAL
  Filled 2015-03-06 (×5): qty 1

## 2015-03-06 MED ORDER — ASPIRIN EC 81 MG PO TBEC
81.0000 mg | DELAYED_RELEASE_TABLET | Freq: Every day | ORAL | Status: DC
  Administered 2015-03-06 – 2015-03-10 (×5): 81 mg via ORAL
  Filled 2015-03-06 (×5): qty 1

## 2015-03-06 MED ORDER — LOSARTAN POTASSIUM-HCTZ 50-12.5 MG PO TABS: 1.0000 | ORAL_TABLET | Freq: Every day | ORAL | Status: DC

## 2015-03-06 MED ORDER — ENOXAPARIN SODIUM 80 MG/0.8ML ~~LOC~~ SOLN
1.0000 mg/kg | Freq: Two times a day (BID) | SUBCUTANEOUS | Status: DC
  Filled 2015-03-06 (×3): qty 0.8

## 2015-03-06 MED ORDER — HEPARIN (PORCINE) IN NACL 100-0.45 UNIT/ML-% IJ SOLN
800.0000 [IU]/h | INTRAMUSCULAR | Status: DC
  Administered 2015-03-06: 800 [IU]/h via INTRAVENOUS
  Filled 2015-03-06: qty 250

## 2015-03-06 MED ORDER — LORAZEPAM 2 MG/ML IJ SOLN
0.2500 mg | Freq: Four times a day (QID) | INTRAMUSCULAR | Status: DC
  Administered 2015-03-06: 0.25 mg via INTRAVENOUS
  Filled 2015-03-06: qty 1

## 2015-03-06 MED ORDER — HEPARIN BOLUS VIA INFUSION
3000.0000 [IU] | Freq: Once | INTRAVENOUS | Status: AC
  Administered 2015-03-06: 3000 [IU] via INTRAVENOUS
  Filled 2015-03-06: qty 3000

## 2015-03-06 NOTE — Progress Notes (Signed)
Physical Therapy Evaluation  Clinical Impression:  Pt with significant fall risk due to L sided weakness, inability to use L UE, and impaired balance. Pt unsafe to return home at this time due to above deficits. Pt to benefit from ST-SNF to achieve safe supervision level of function for safe transition home with spouse.   03/06/15 1147  PT Visit Information  Last PT Received On 03/06/15  Assistance Needed +2  History of Present Illness 81 yo male with htn, cva, left vertebral artery stenosis on prior MRA apparently c/o left leg weakness starting this am. Pt with L humeral fracture as well. CT brain negative.  Precautions  Precautions Fall  Required Braces or Orthoses Sling (L UE)  Restrictions  Weight Bearing Restrictions Yes  LUE Weight Bearing NWB (no order but suspect NWB due to humerus fracture)  Home Living  Family/patient expects to be discharged to: Private residence  Living Arrangements Spouse/significant other  Available Help at Discharge Family;Available 24 hours/day (however spouse unable to physically assist)  Type of Home House  Home Access Level entry  Home Layout One level  Home Equipment Walker - 2 wheels;Cane - single point  Prior Function  Level of Independence Independent  Communication  Communication No difficulties  Pain Assessment  Pain Assessment 0-10  Pain Score 3  Cognition  Arousal/Alertness Awake/alert  Behavior During Therapy WFL for tasks assessed/performed  Overall Cognitive Status Impaired/Different from baseline  Area of Impairment Safety/judgement  Safety/Judgement Decreased awareness of deficits  General Comments pt feels he can manage at home even though it requires maxA to maintain standing  Upper Extremity Assessment  Upper Extremity Assessment RUE deficits/detail;LUE deficits/detail  RUE Deficits / Details generalized weakness  LUE Deficits / Details no ROM completed due to humerus fracture  Lower Extremity Assessment  Lower Extremity  Assessment RLE deficits/detail;LLE deficits/detail  RLE Deficits / Details grossly 3+/5  LLE Deficits / Details grossly 3-/5  Cervical / Trunk Assessment  Cervical / Trunk Assessment Kyphotic  Bed Mobility  Overal bed mobility Needs Assistance  Bed Mobility Supine to Sit  Supine to sit Mod assist;HOB elevated  General bed mobility comments pt attempting to use R UE to pull self up on bed rail, pt able to bring LEs off edge of bed but requiring asisst for trunk  elevation adn to bring hips to eob  Transfers  Overall transfer level Needs assistance  Equipment used 1 person hand held assist  Transfers Sit to/from Stand  Sit to Stand Mod assist;+2 safety/equipment  General transfer comment provided R HHA. increased time, unsteady, increased time to achieve full bilat knee extension  Ambulation/Gait  Ambulation/Gait assistance Max assist;+2 safety/equipment  Ambulation Distance (Feet) 30 Feet  Assistive device 1 person hand held assist;2 person hand held assist  General Gait Details began with 2 person HHA then transitioned to R HHA however with R HHA pt with strong L Lateral lean, inability to maintain midline position. decreased L LE step length and WBing  Gait Pattern/deviations Step-to pattern;Decreased step length - left;Decreased stance time - left;Decreased weight shift to left;Staggering left;Trunk flexed;Narrow base of support  Gait velocity slow  Modified Rankin (Stroke Patients Only)  Pre-Morbid Rankin Score 3  Modified Rankin 5  Balance  Overall balance assessment Needs assistance  Sitting-balance support Feet supported;Single extremity supported  Sitting balance-Leahy Scale Poor  Postural control Posterior lean;Left lateral lean  Standing balance support Single extremity supported  Standing balance-Leahy Scale Poor  Standing balance comment Left lateral lean  PT -  End of Session  Equipment Utilized During Treatment Gait belt  Activity Tolerance Patient tolerated treatment  well  Patient left in chair;with call bell/phone within reach  Nurse Communication Mobility status  PT Assessment  PT Therapy Diagnosis  Difficulty walking  PT Recommendation/Assessment Patient needs continued PT services  PT Problem List Decreased strength;Decreased balance;Decreased activity tolerance;Decreased mobility;Decreased safety awareness  Barriers to Discharge Decreased caregiver support  Barriers to Discharge Comments wife unable to provide physical assist  PT Plan  PT Frequency (ACUTE ONLY) Min 4X/week  PT Treatment/Interventions (ACUTE ONLY) DME instruction;Gait training;Functional mobility training;Therapeutic activities;Therapeutic exercise;Balance training;Neuromuscular re-education  PT Recommendation  Follow Up Recommendations SNF;Supervision/Assistance - 24 hour  PT equipment None recommended by PT  Individuals Consulted  Consulted and Agree with Results and Recommendations Patient  Acute Rehab PT Goals  Patient Stated Goal home  PT Goal Formulation With patient  Time For Goal Achievement 03/13/15  Potential to Achieve Goals Good  PT Time Calculation  PT Start Time (ACUTE ONLY) 1147  PT Stop Time (ACUTE ONLY) 1208  PT Time Calculation (min) (ACUTE ONLY) 21 min  PT General Charges  $$ ACUTE PT VISIT 1 Procedure  PT Evaluation  $Initial PT Evaluation Tier I 1 Procedure  Written Expression  Dominant Hand Right   Lewis ShockAshly Chondra Boyde, PT, DPT Pager #: [phone] Office #: [phone]

## 2015-03-06 NOTE — Progress Notes (Signed)
ANTICOAGULATION CONSULT NOTE - Initial Consult  Pharmacy Consult for Heparin  Indication: chest pain/ACS, rising troponin  No Known Allergies  Patient Measurements: Height: 5\' 10"  (177.8 cm) (pt estimated, states height has changed a lot as he has aged) Weight: 158 lb 4.6 oz (71.8 kg) IBW/kg (Calculated) : 73  Vital Signs: Temp: 97.7 F (36.5 C) (05/15 0044) Temp Source: Axillary (05/15 0044) BP: 137/80 mmHg (05/15 0044) Pulse Rate: 99 (05/15 0044)  Labs:  Recent Labs  03/05/15 1631 03/05/15 1639 03/05/15 2305 03/06/15 0245  HGB 15.2 16.3  --   --   HCT 46.1 48.0  --   --   PLT 160  --   --   --   APTT 27  --   --   --   LABPROT 14.9  --   --   --   INR 1.16  --   --   --   CREATININE 1.01 0.90  --  1.12  CKTOTAL  --   --   --  106  CKMB  --   --   --  12.1*  TROPONINI  --   --  0.70* 1.61*    Estimated Creatinine Clearance: 42.7 mL/min (by C-G formula based on Cr of 1.12).  Medical History: Past Medical History  Diagnosis Date  . Hypertension   . Stroke 1992  . Kidney stone    Assessment: 81 y/o M with rising troponin (0.7>>1.61) to start heparin per pharmacy. Lovenox was ordered earlier, but never given. CBC/renal function good. Other labs as above.   Goal of Therapy:  Heparin level 0.3-0.7 units/ml Monitor platelets by anticoagulation protocol: Yes   Plan:  -Heparin 3000 units BOLUS -Start heparin drip at 800 units/hr -1330 HL -Daily CBC/HL -Monitor for bleeding  Abran DukeLedford, Jeffry 03/06/2015,5:14 AM

## 2015-03-06 NOTE — Progress Notes (Signed)
*  PRELIMINARY RESULTS* Vascular Ultrasound Carotid Duplex (Doppler) has been completed.  Preliminary findings: Bilateral:  1-39% ICA stenosis.  Vertebral artery flow is antegrade.      Farrel DemarkJill Eunice, RDMS, RVT  03/06/2015, 3:28 PM

## 2015-03-06 NOTE — Progress Notes (Deleted)
*  PRELIMINARY RESULTS* Echocardiogram 2D Echocardiogram has been performed.  Aris EvertsRix, Ruey Storer A 03/06/2015, 3:41 PM

## 2015-03-06 NOTE — Progress Notes (Signed)
[facility] PROGRESS NOTE   KHAZA BLANSETT ZOX:[id number] DOB: 1921/06/01 DOA: 03/05/2015 PCP: Kristian Covey, MD  HPI/Subjective: Left upper arm pain, likely secondary to his humeral fracture. Still has weakness in the left lower extremity.  Assessment/Plan: Principal Problem:   CVA (cerebral infarction) Active Problems:   Essential hypertension   Hyperglycemia   Leg weakness   Closed left humeral fracture    CVA Patient presented with left-sided weakness, difficulty with walking. CT did not show bleeding, MRI of the brain showed acute stroke involving right posterior frontal lobe (R MCA territory). Neurology consultant recommended to continue stroke workup. Line get lipid profile, hemoglobin A1c. Bilateral carotid duplex, telemetry to rule out arrhythmias. Per neurology aspirin and Plavix. Past RN stroke swallow screen, started on heart healthy diet.  Elevated troponin Troponin elevated at 0.7, 1.6 and 1.38. Patient denies any chest pain, denies any palpitations. EKG did not show evidence of acute ischemia, has RBBB. Discussed with Dr. Myrtis Ser over the phone, likely troponin elevation likely secondary to acute stroke. Heparin discontinued, fear of transformation of CVA into hemorrhagic one, continue beta blockers.  Left humeral fracture Patient had left humeral fracture since a week before Thanksgiving, wearing a sling for it. He follows with Dr. Ophelia Charter. Chest x-ray still showing nonhealing.  Essential hypertension Continue medications.  Hyperglycemia Blood sugar of 150, patient was not nothing by mouth, check hemoglobin A1c.  Code Status: Full Code Family Communication: Plan discussed with the patient. Disposition Plan: Remains inpatient Diet: Diet Heart Room service appropriate?: Yes; Fluid consistency:: Thin  Consultants:  Neurology  Procedures:  None  Antibiotics:  None   Objective: Filed Vitals:   03/06/15 0804  BP: 121/70  Pulse: 78    Temp: 97.8 F (36.6 C)  Resp: 17    Intake/Output Summary (Last 24 hours) at 03/06/15 1134 Last data filed at 03/06/15 0800  Gross per 24 hour  Intake  68.53 ml  Output   1380 ml  Net -1311.47 ml   Filed Weights   03/05/15 1618 03/05/15 2115  Weight: 70.761 kg (156 lb) 71.8 kg (158 lb 4.6 oz)    Exam: General: Alert and awake, oriented x3, not in any acute distress. HEENT: anicteric sclera, pupils reactive to light and accommodation, EOMI CVS: S1-S2 clear, no murmur rubs or gallops Chest: clear to auscultation bilaterally, no wheezing, rales or rhonchi Abdomen: soft nontender, nondistended, normal bowel sounds, no organomegaly Extremities: no cyanosis, clubbing or edema noted bilaterally Neuro: Cranial nerves II-XII intact, no focal neurological deficits  Data Reviewed: Basic Metabolic Panel:  Recent Labs Lab 03/05/15 1631 03/05/15 1639 03/06/15 0245  NA 138 142 141  K 3.7 3.7 3.7  CL 102 98* 102  CO2 29  --  28  GLUCOSE 150* 147* 134*  BUN CREATININE 1.01 0.90 1.12  CALCIUM 8.8*  --  9.2   Liver Function Tests:  Recent Labs Lab 03/05/15 1631 03/06/15 0245  AST 18 24  ALT 10* 9*  ALKPHOS 95 100  BILITOT 0.9 1.1  PROT 6.3* 6.1*  ALBUMIN 3.2* 3.1*   No results for input(s): LIPASE, AMYLASE in the last 168 hours. No results for input(s): AMMONIA in the last 168 hours. CBC:  Recent Labs Lab 03/05/15 1631 03/05/15 1639  WBC 5.7  --   NEUTROABS 3.1  --   HGB 15.2 16.3  HCT 46.1 48.0  MCV 100.2*  --   PLT 160  --    Cardiac Enzymes:  Recent Labs Lab  03/05/15 2305 03/06/15 0245 03/06/15 0913  CKTOTAL  --  106  --   CKMB  --  12.1*  --   TROPONINI 0.70* 1.61* 1.38*   BNP (last 3 results) No results for input(s): BNP in the last 8760 hours.  ProBNP (last 3 results) No results for input(s): PROBNP in the last 8760 hours.  CBG:  Recent Labs Lab 03/05/15 1623  GLUCAP 136*    Micro Recent Results (from the past 240  hour(s))  MRSA PCR Screening     Status: None   Collection Time: 03/05/15 10:05 PM  Result Value Ref Range Status   MRSA by PCR NEGATIVE NEGATIVE Final    Comment:        The GeneXpert MRSA Assay (FDA approved for NASAL specimens only), is one component of a comprehensive MRSA colonization surveillance program. It is not intended to diagnose MRSA infection nor to guide or monitor treatment for MRSA infections.      Studies: Dg Chest 1 View  03/06/2015   CLINICAL DATA:  Acute onset of left leg weakness. Initial encounter.  EXAM: CHEST  1 VIEW  COMPARISON:  Chest radiograph performed 05/03/2014, and CT of the chest performed 05/06/2014  FINDINGS: The lungs are well-aerated. Vascular congestion is noted. Mildly increased interstitial markings may reflect mild interstitial edema. Mild left basilar atelectasis is noted. There is no evidence of pleural effusion or pneumothorax.  The cardiomediastinal silhouette is mildly enlarged. A significantly displaced comminuted fracture of the left proximal humeral diaphysis is noted, with approximately 1 shaft width medial displacement of the distal humerus.  IMPRESSION: 1. Vascular congestion and mild cardiomegaly. Mildly increased interstitial markings may reflect mild interstitial edema. Mild left basilar atelectasis noted. 2. Significantly displaced comminuted fracture of the left proximal humeral diaphysis, with approximately 1 shaft width medial displacement of the distal humerus.   Electronically Signed   By: Roanna RaiderJeffery  Chang M.D.   On: 03/06/2015 02:49   Ct Head Wo Contrast  03/05/2015   CLINICAL DATA:  Code stroke. Acute onset of left-sided weakness. Recent fall. Initial encounter.  EXAM: CT HEAD WITHOUT CONTRAST  TECHNIQUE: Contiguous axial images were obtained from the base of the skull through the vertex without intravenous contrast.  COMPARISON:  CT of the head performed 01/12/2009  FINDINGS: There is no evidence of acute infarction, mass lesion,  or intra- or extra-axial hemorrhage on CT.  Prominence of the ventricles and sulci reflects mild cortical volume loss. Cerebellar atrophy is noted. Scattered periventricular white matter change likely reflects small vessel ischemic microangiopathy. A chronic lacunar infarct is noted at the left cerebellar hemisphere. Chronic ischemic change is noted at the basal ganglia and thalami bilaterally.  The brainstem and fourth ventricle are within normal limits. The cerebral hemispheres demonstrate grossly normal gray-white differentiation. No mass effect or midline shift is seen.  There is no evidence of fracture; scattered nonspecific lucencies within the visualized osseous structures appear grossly stable from 2010. The visualized portions of the orbits are within normal limits. The patient is status post bilateral maxillary antrostomy. Mild mucosal thickening is noted at the right maxillary sinus. There is opacification of the right frontal sinus, and partial opacification of the mastoid air cells bilaterally. The remaining paranasal sinuses are well-aerated. No significant soft tissue abnormalities are seen.  IMPRESSION: 1. No acute intracranial pathology seen on CT. 2. Mild cortical volume loss and scattered small vessel ischemic microangiopathy. 3. Chronic ischemic change at the basal ganglia and thalami bilaterally. Chronic lacunar infarct at the  left cerebellar hemisphere. 4. Mild mucosal thickening at the right maxillary sinus, opacification of the right frontal sinus, and partial opacification of the mastoid air cells bilaterally.   Electronically Signed   By: Roanna RaiderJeffery  Chang M.D.   On: 03/05/2015 17:27   Mr Brain Wo Contrast  03/06/2015   CLINICAL DATA:  Acute onset LEFT-sided weakness, paresthesias. History of dementia, hypertension and stroke. Known LEFT vertebral artery stenosis.  EXAM: MRI HEAD WITHOUT CONTRAST  MRA HEAD WITHOUT CONTRAST  TECHNIQUE: Multiplanar, multiecho pulse sequences of the brain and  surrounding structures were obtained without intravenous contrast. Angiographic images of the head were obtained using MRA technique without contrast.  COMPARISON:  CT of the head Mar 05, 2015  FINDINGS: MRI HEAD FINDINGS  11 mm area patchy reduced diffusion within RIGHT posterior frontal lobe, precentral gyrus. Corresponding low ADC value. Minimal T2 hyperintense signal.  A few punctate foci of susceptibility artifact in the periphery of the cerebellum and, subcentimeter focus of susceptibility artifact in LEFT thalamus. Ventricles and sulci are normal for patient's age. Remote LEFT thalamus, bilateral basal ganglia lacunar infarcts. Remote small LEFT cerebellar infarct. Patchy to confluent supratentorial white matter FLAIR T2 hyperintensities. No midline shift, mass effect or mass lesions. Multiple tiny perivascular spaces in the basal ganglia.  No abnormal extra-axial fluid collections. Bilateral ocular lens implant. Soft tissue opacifies the RIGHT frontal sinus, sequelae of chronic sinusitis, status post FESS. Small bilateral mastoid effusions. No abnormal sellar expansion. No cerebellar tonsillar ectopia. No suspicious calvarial bone marrow signal. Patient appears edentulous.  MRA HEAD FINDINGS (decreased signal to noise ratio with the skullbase may be related to motion)  Anterior circulation: Normal flow related enhancement of the cervical, petrous, cavernous and supra clinoid internal carotid arteries bilaterally. Anterior communicating arteries is patent. Normal flow related enhancement of the anterior and middle cerebral arteries.  Posterior circulation: Low signal to noise ratio. Limited assessment for subtle vertebral artery luminal regularity. RIGHT vertebral artery appears dominant, flow related enhancement within the basilar artery, which is widely patent. Gradual tapering of the distal LEFT V4 segment. Robust bilateral posterior communicating arteries contributing to the posterior circulation with  flow related enhancement within the bilateral posterior cerebral arteries.  No high-grade stenosis, aneurysm, suspicious luminal irregularity  IMPRESSION: MRI HEAD: Small area of acute ischemia within RIGHT posterior frontal lobe (middle cerebral artery territory).  Involutional changes. Moderate to severe white matter changes suggest chronic small vessel ischemic disease.  Remote LEFT thalamic hemorrhagic infarct. Bilateral basal ganglia lacunar infarcts.  MRA HEAD: Generally decreased signal to noise ratio limits evaluation without large vessel occlusion. Possible stenosis of the distal LEFT V4 segment versus artifact.   Electronically Signed   By: Awilda Metroourtnay  Bloomer   On: 03/06/2015 03:02   Mr Maxine GlennMra Head/brain Wo Cm  03/06/2015   CLINICAL DATA:  Acute onset LEFT-sided weakness, paresthesias. History of dementia, hypertension and stroke. Known LEFT vertebral artery stenosis.  EXAM: MRI HEAD WITHOUT CONTRAST  MRA HEAD WITHOUT CONTRAST  TECHNIQUE: Multiplanar, multiecho pulse sequences of the brain and surrounding structures were obtained without intravenous contrast. Angiographic images of the head were obtained using MRA technique without contrast.  COMPARISON:  CT of the head Mar 05, 2015  FINDINGS: MRI HEAD FINDINGS  11 mm area patchy reduced diffusion within RIGHT posterior frontal lobe, precentral gyrus. Corresponding low ADC value. Minimal T2 hyperintense signal.  A few punctate foci of susceptibility artifact in the periphery of the cerebellum and, subcentimeter focus of susceptibility artifact in LEFT thalamus. Ventricles  and sulci are normal for patient's age. Remote LEFT thalamus, bilateral basal ganglia lacunar infarcts. Remote small LEFT cerebellar infarct. Patchy to confluent supratentorial white matter FLAIR T2 hyperintensities. No midline shift, mass effect or mass lesions. Multiple tiny perivascular spaces in the basal ganglia.  No abnormal extra-axial fluid collections. Bilateral ocular lens  implant. Soft tissue opacifies the RIGHT frontal sinus, sequelae of chronic sinusitis, status post FESS. Small bilateral mastoid effusions. No abnormal sellar expansion. No cerebellar tonsillar ectopia. No suspicious calvarial bone marrow signal. Patient appears edentulous.  MRA HEAD FINDINGS (decreased signal to noise ratio with the skullbase may be related to motion)  Anterior circulation: Normal flow related enhancement of the cervical, petrous, cavernous and supra clinoid internal carotid arteries bilaterally. Anterior communicating arteries is patent. Normal flow related enhancement of the anterior and middle cerebral arteries.  Posterior circulation: Low signal to noise ratio. Limited assessment for subtle vertebral artery luminal regularity. RIGHT vertebral artery appears dominant, flow related enhancement within the basilar artery, which is widely patent. Gradual tapering of the distal LEFT V4 segment. Robust bilateral posterior communicating arteries contributing to the posterior circulation with flow related enhancement within the bilateral posterior cerebral arteries.  No high-grade stenosis, aneurysm, suspicious luminal irregularity  IMPRESSION: MRI HEAD: Small area of acute ischemia within RIGHT posterior frontal lobe (middle cerebral artery territory).  Involutional changes. Moderate to severe white matter changes suggest chronic small vessel ischemic disease.  Remote LEFT thalamic hemorrhagic infarct. Bilateral basal ganglia lacunar infarcts.  MRA HEAD: Generally decreased signal to noise ratio limits evaluation without large vessel occlusion. Possible stenosis of the distal LEFT V4 segment versus artifact.   Electronically Signed   By: Awilda Metro   On: 03/06/2015 03:02    Scheduled Meds: . aspirin EC  81 mg Oral Daily  . clopidogrel  75 mg Oral Daily  . hydrochlorothiazide  12.5 mg Oral Daily  . losartan  50 mg Oral Daily  . metoprolol tartrate  12.5 mg Oral BID  . sodium chloride  3  mL Intravenous Q12H   Continuous Infusions:      Time spent: 35 minutes    [facility] A  [facility] Pager [phone] If 7PM-7AM, please contact night-coverage at [email or web address], password [facility] 03/06/2015, 11:34 AM  LOS: 1 day

## 2015-03-06 NOTE — Progress Notes (Signed)
  Echocardiogram 2D Echocardiogram has been performed.  Ronald [name], Ronald [name] A 03/06/2015, 3:43 PM
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
## 2015-03-06 NOTE — Progress Notes (Signed)
UR COMPLETED  

## 2015-03-06 NOTE — Progress Notes (Signed)
STROKE TEAM PROGRESS NOTE   HISTORY Ronald [name] is a 81 y.o. male hx of HTN, CVA, left vertebral artery stenosis presenting with acute onset of left sided weakness involving the leg > arm. He also notes some associated paresthesias on the left side. He takes a daily ASA 81mg  and Plavix 75mg . Patients left shoulder is in a sling secondary to a humeral fracture from a fall in 12/2014.   CT head imaging reviewed shows no acute process.   Date last known well: 03/04/2015 Time last known well: 2200 tPA Given: no, outside IV tPA window   SUBJECTIVE (INTERVAL HISTORY) His RN is at the bedside.  Overall he feels his condition is gradually improving.    OBJECTIVE Temp:  [97.5 F (36.4 C)-97.9 F (36.6 C)] 97.8 F (36.6 C) (05/15 0804) Pulse Rate:  [71-119] 78 (05/15 0804) Cardiac Rhythm:  [-] Normal sinus rhythm;Heart block (05/14 2300) Resp:  [15-35] 17 (05/15 0804) BP: (121-198)/(70-115) 121/70 mmHg (05/15 0804) SpO2:  [90 %-97 %] 95 % (05/15 0804) Weight:  [70.761 kg (156 lb)-71.8 kg (158 lb 4.6 oz)] 71.8 kg (158 lb 4.6 oz) (05/14 2115)   Recent Labs Lab 03/05/15 1623  GLUCAP 136*    Recent Labs Lab 03/05/15 1631 03/05/15 1639 03/06/15 0245  NA 138 142 141  K 3.7 3.7 3.7  CL 102 98* 102  CO2 29  --  28  GLUCOSE 150* 147* 134*  BUN 13 16 14   CREATININE 1.01 0.90 1.12  CALCIUM 8.8*  --  9.2    Recent Labs Lab 03/05/15 1631 03/06/15 0245  AST 18 24  ALT 10* 9*  ALKPHOS 95 100  BILITOT 0.9 1.1  PROT 6.3* 6.1*  ALBUMIN 3.2* 3.1*    Recent Labs Lab 03/05/15 1631 03/05/15 1639  WBC 5.7  --   NEUTROABS 3.1  --   HGB 15.2 16.3  HCT 46.1 48.0  MCV 100.2*  --   PLT 160  --     Recent Labs Lab 03/05/15 2305 03/06/15 0245  CKTOTAL  --  106  CKMB  --  12.1*  TROPONINI 0.70* 1.61*    Recent Labs  03/05/15 1631  LABPROT 14.9  INR 1.16    Recent Labs  03/05/15 2135  COLORURINE YELLOW  LABSPEC 1.006  PHURINE 7.0  GLUCOSEU NEGATIVE  HGBUR  LARGE*  BILIRUBINUR NEGATIVE  KETONESUR NEGATIVE  PROTEINUR NEGATIVE  UROBILINOGEN 1.0  NITRITE NEGATIVE  LEUKOCYTESUR NEGATIVE       Component Value Date/Time   CHOL 116 03/06/2015 0245   TRIG 48 03/06/2015 0245   HDL 48 03/06/2015 0245   CHOLHDL 2.4 03/06/2015 0245   VLDL 10 03/06/2015 0245   LDLCALC 58 03/06/2015 0245   No results found for: HGBA1C No results found for: LABOPIA, COCAINSCRNUR, LABBENZ, AMPHETMU, THCU, LABBARB  No results for input(s): ETH in the last 168 hours.  Dg Chest 1 View 03/06/2015    1. Vascular congestion and mild cardiomegaly. Mildly increased interstitial markings may reflect mild interstitial edema. Mild left basilar atelectasis noted.  2. Significantly displaced comminuted fracture of the left proximal humeral diaphysis, with approximately 1 shaft width medial displacement of the distal humerus.      Ct Head Wo Contrast 03/05/2015    1. No acute intracranial pathology seen on CT.  2. Mild cortical volume loss and scattered small vessel ischemic microangiopathy.  3. Chronic ischemic change at the basal ganglia and thalami bilaterally. Chronic lacunar infarct at the left cerebellar hemisphere.  4. Mild mucosal thickening at the right maxillary sinus, opacification of the right frontal sinus, and partial opacification of the mastoid air cells bilaterally.      Mr Brain Wo Contrast 03/06/2015 Awake alert oriented x 3 normal speech and language. Mild left lower face asymmetry. Tongue midline. No drift. Mild diminished fine finger movements on left. Orbits right over left upper extremity. Mild left grip weak.. Normal sensation . Normal coordination.    MRI HEAD:  Small area of acute ischemia within RIGHT posterior frontal lobe (middle cerebral artery territory).   Involutional changes. Moderate to severe white matter changes suggest chronic small vessel ischemic disease.  Remote LEFT thalamic hemorrhagic infarct. Bilateral basal ganglia lacunar infarcts.     MRA HEAD:  Generally decreased signal to noise ratio limits evaluation without large vessel occlusion. Possible stenosis of the distal LEFT V4 segment versus artifact.      PHYSICAL EXAM Pleasant elderly Caucasian male not in distress.wearing left arm sling. . Afebrile. Head is nontraumatic. Neck is supple without bruit.    Cardiac exam no murmur or gallop. Lungs are clear to auscultation. Distal pulses are well felt. Neurological Exam : Awake alert oriented x 3 normal speech and language. Mild left lower face asymmetry. Tongue midline.Left upper and lower extremity drift.3/5 strength on left side Mild diminished fine finger movements on left.   Mild left grip weak.. Normal sensation . Normal coordination.    ASSESSMENT/PLAN Ronald [name] is a 81 y.o. male with history of hypertension, ongoing tobacco use, previous CVA, and left vertebral artery stenosis, presenting with left hemiparesis and paresthesias. He did not receive IV t-PA due to late presentation.  Stroke:  Non-dominant right posterior frontal lobe (middle cerebral artery territory). Etiology likely small vessel disaese   Resultant  Left hemiplegia, left face weakness  MRI - as above  MRA - no large vessel occlusion  Carotid Doppler  pending  2D Echo pending  LDL - 58  HgbA1c pending  Subcutaneous heparin for VTE prophylaxis  Diet NPO time specified  aspirin 81 mg orally every day and clopidogrel 75 mg orally every day prior to admission, now on aspirin 81 mg orally every day and clopidogrel 75 mg orally every day  Ongoing aggressive stroke risk factor management  Therapy recommendations: Pending  Disposition: Pending  Hypertension  Home meds:  Losartan/hydrochlorothiazide  Stable   Other Stroke Risk Factors  Advanced age  Cigarette smoker, advised to stop smoking  History of previous strokes   Other Active Problems  Decreased nutritional stores  Other Pertinent History    Hospital  day # 1 I have personally examined this patient, reviewed notes, independently viewed imaging studies, participated in medical decision making and plan of care. I have made any additions or clarifications directly to the above note. He presented with left sided weakness and speech difficulties likely from  Right frontal infarct from small vessel disease and remains at risk for recurrent strokes/TIA and neurological worsening. Continue ongoing stroke evaluation and agrressive risk factor modification.   Delia HeadyPramod Tifanny Dollens, MD Medical Director [facility] Pager: [phone] 03/06/2015 4:52 PM    To contact Stroke Continuity provider, please refer to [email or web address]. After hours, contact General Neurology
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
## 2015-03-06 NOTE — Progress Notes (Signed)
Shift event note:  Notified at 0030 by RN regarding first troponin result of 0.70. Pt has not had any c/o CP. Was noted to be tachycardic in ED, however RN reports that when pt arrived to floor reported needed to void but unable. I&O cath performed and drained approx 750cc urine. After this pt's tachycardia resolved. Spoke with Dr Selena BattenKim re: elevated troponin. Dr Selena BattenKim requested pt be started on Lovenox (1mg /kg), repeat EKG, and add ck-mb to next troponin. At 0445 RN notified of 2nd troponin result of 1.61. CK-MB also elevated at 12.1 w/ RI of  11.4.  Pt continues to deny CP, HR remains 80-90's and pt has rested in NAD. Repeat EKG w/o acute changes. Consulted Dr Mayford Knifeurner w/ cardiology service. She recommended adding Metoprolol 12.5 mg BID and re-consulting cardiology in am. Discussed w/ Dr Allena KatzPatel. Apparently Lovenox was held d/t RN noted blood when she cathed pt. Dr Allena KatzPatel recommends to d/c Lovenox since has not been started and start Heparin qtt. Will add Metoprolol and Heparin qtt per Rx. Will continue to monitor closely on SDU.   Leanne ChangKatherine P. Analei Whinery, NP-C [facility] Pager [phone]

## 2015-03-06 NOTE — Progress Notes (Signed)
CRITICAL VALUE ALERT  Critical value received:  Troponin 0.70  Date of notification:  03/06/2015  Time of notification:  12:11 AM  Critical value read back:Yes.    MD notified (1st page):  Schorr Selena Batten(Kim pager not receiving pages)  Time of first page:  12:12 AM  Responding MD:  Alden ServerShorr after conferring with Selena BattenKim  Time MD responded:  12:31 AM

## 2015-03-06 NOTE — Progress Notes (Signed)
Troponin trending up, now 1.61. Hospitalist notified. Pt continues to be asymptomatic and denies all pain. Will consult cardiology and continue to monitor.

## 2015-03-07 DIAGNOSIS — R410 Disorientation, unspecified: Secondary | ICD-10-CM | POA: Diagnosis present

## 2015-03-07 LAB — BASIC METABOLIC PANEL
Anion gap: 11 (ref 5–15)
BUN: 18 mg/dL (ref 6–20)
CHLORIDE: 102 mmol/L (ref 101–111)
CO2: 25 mmol/L (ref 22–32)
Calcium: 9.1 mg/dL (ref 8.9–10.3)
Creatinine, Ser: 1.16 mg/dL (ref 0.61–1.24)
GFR, EST NON AFRICAN AMERICAN: 53 mL/min — AB (ref >60)
GLUCOSE: 130 mg/dL — AB (ref 65–99)
POTASSIUM: 3.5 mmol/L (ref 3.5–5.1)
Sodium: 138 mmol/L (ref 135–145)

## 2015-03-07 LAB — CBC
HCT: 48.5 % (ref 39.0–52.0)
HEMOGLOBIN: 16.4 g/dL (ref 13.0–17.0)
MCH: 33.4 pg (ref 26.0–34.0)
MCHC: 33.8 g/dL (ref 30.0–36.0)
MCV: 98.8 fL (ref 78.0–100.0)
Platelets: 178 10*3/uL (ref 150–400)
RBC: 4.91 MIL/uL (ref 4.22–5.81)
RDW: 12.7 % (ref 11.5–15.5)
WBC: 11.9 10*3/uL — ABNORMAL HIGH (ref 4.0–10.5)

## 2015-03-07 LAB — BLOOD GAS, ARTERIAL
Acid-Base Excess: 4.9 mmol/L — ABNORMAL HIGH (ref 0.0–2.0)
BICARBONATE: 28.4 meq/L — AB (ref 20.0–24.0)
Drawn by: 270271
O2 Content: 4 L/min
O2 Saturation: 95.5 %
PH ART: 7.483 — AB (ref 7.350–7.450)
Patient temperature: 98.6
TCO2: 29.6 mmol/L (ref 0–100)
pCO2 arterial: 38.3 mmHg (ref 35.0–45.0)
pO2, Arterial: 80.9 mmHg (ref 80.0–100.0)

## 2015-03-07 LAB — HEMOGLOBIN A1C
HEMOGLOBIN A1C: 5.2 % (ref 4.8–5.6)
Mean Plasma Glucose: 103 mg/dL

## 2015-03-07 MED ORDER — FUROSEMIDE 10 MG/ML IJ SOLN
40.0000 mg | Freq: Once | INTRAMUSCULAR | Status: AC
  Administered 2015-03-07: 40 mg via INTRAVENOUS
  Filled 2015-03-07: qty 4

## 2015-03-07 MED ORDER — POTASSIUM CHLORIDE CRYS ER 20 MEQ PO TBCR
40.0000 meq | EXTENDED_RELEASE_TABLET | Freq: Once | ORAL | Status: AC
  Administered 2015-03-07: 40 meq via ORAL
  Filled 2015-03-07: qty 2

## 2015-03-07 MED ORDER — LORAZEPAM 2 MG/ML IJ SOLN
0.2500 mg | Freq: Four times a day (QID) | INTRAMUSCULAR | Status: AC | PRN
  Administered 2015-03-07 – 2015-03-08 (×2): 0.25 mg via INTRAVENOUS
  Filled 2015-03-07 (×2): qty 1

## 2015-03-07 MED ORDER — LORAZEPAM 2 MG/ML IJ SOLN: 0.2500 mg | Freq: Four times a day (QID) | INTRAMUSCULAR | Status: DC | PRN

## 2015-03-07 NOTE — Progress Notes (Signed)
Physical Therapy Treatment Patient Details Name: Ronald PhenesJames L [name] MRN: [id number] DOB: 01/23/1921 Today's Date: 03/07/2015    History of Present Illness 81 yo male with htn, cva, left vertebral artery stenosis on prior MRA apparently c/o left leg weakness starting this am. Pt with L humeral fracture as well. CT brain negative.  As of 5/16 evaluation, no spontaneous or voluntary movement on L side, upper or lower.    PT Comments    Even after awakening pt, unable to activate L side today.  No voluntary or spontaneous movements.  Emphasized truncal activation and reaching tasks and stood to see if we could see any L side movement.  Follow Up Recommendations  SNF;Supervision/Assistance - 24 hour     Equipment Recommendations  None recommended by PT    Recommendations for Other Services       Precautions / Restrictions Precautions Precautions: Fall Required Braces or Orthoses: Sling Restrictions LUE Weight Bearing: Non weight bearing    Mobility  Bed Mobility Overal bed mobility: Needs Assistance Bed Mobility: Supine to Sit;Sit to Supine     Supine to sit: +2 for physical assistance;Total assist Sit to supine: Max assist;+2 for physical assistance   General bed mobility comments: pt not awake enough to participate intially and needed full assist of trunk and LE's to get back into bed.  Did not initiate any scooting to/from EOB to cues  Transfers Overall transfer level: Needs assistance   Transfers: Sit to/from Stand Sit to Stand: Max assist;+2 safety/equipment         General transfer comment: pt required full assist/support of Left side.  Noted no attempt to initiate with the Left side.  Pt assist was fully from the Right side.  Ambulation/Gait             General Gait Details: not appropriate today, nurse notified of changes   Stairs            Wheelchair Mobility    Modified Rankin (Stroke Patients Only) Modified Rankin (Stroke Patients  Only) Pre-Morbid Rankin Score: Moderate disability Modified Rankin: Severe disability     Balance Overall balance assessment: Needs assistance Sitting-balance support: Single extremity supported Sitting balance-Leahy Scale: Poor Sitting balance - Comments: Goal to awaken pt and activate trunk with reaching tasks mostly ant/post.  Pt needing facilitation to stay symmetrical due to lean to the Left.  Initiated reaching with hand over hand guidance and then facilitation through trunk.   Standing balance support: Bilateral upper extremity supported Standing balance-Leahy Scale: Poor Standing balance comment: lean, collapse to the Left, with total support at L LE and trunk.                    Cognition Arousal/Alertness: Lethargic (but finally got pt to wake up and participate) Behavior During Therapy: Flat affect (cooperative) Overall Cognitive Status: Impaired/Different from baseline Area of Impairment: Safety/judgement;Following commands;Orientation;Attention   Current Attention Level: Sustained   Following Commands: Follows one step commands inconsistently;Follows one step commands with increased time Safety/Judgement: Decreased awareness of deficits          Exercises      General Comments        Pertinent Vitals/Pain Pain Assessment: Faces Faces Pain Scale: No hurt    Home Living                      Prior Function            PT Goals (current goals can  now be found in the care plan section) Acute Rehab PT Goals Patient Stated Goal: home PT Goal Formulation: With patient Time For Goal Achievement: 03/13/15 Potential to Achieve Goals: Fair Progress towards PT goals: Not progressing toward goals - comment (L side showing no spontaneous or voluntary movement)    Frequency  Min 3X/week    PT Plan Current plan remains appropriate;Frequency needs to be updated    Co-evaluation             End of Session   Activity Tolerance: Patient  tolerated treatment well Patient left: in bed;with call bell/phone within reach;with restraints reapplied;with bed alarm set;with SCD's reapplied     Time: 1039-1105 PT Time Calculation (min) (ACUTE ONLY): 26 min  Charges:  $Therapeutic Activity: 8-22 mins                    G Codes:      Zaiden Ludlum, Eliseo GumKenneth V 03/07/2015, 11:42 AM 03/07/2015  [place] BingKen Watt Geiler, PT [phone]  (pager)

## 2015-03-07 NOTE — Clinical Social Work Note (Signed)
Clinical Social Work Assessment  Patient Details  Name: Ronald PhenesJames L Losey MRN: [id number] Date of Birth: 03/17/1921  Date of referral:  03/07/15               Reason for consult:  Facility Placement              Housing/Transportation Living arrangements for the past 2 months:  Single Family Home Source of Information:  Spouse Patient Interpreter Needed:  None Criminal Activity/Legal Involvement Pertinent to Current Situation/Hospitalization:  No - Comment as needed Significant Relationships:  None Lives with:  Spouse Need for family participation in patient care:  Yes (Comment)  Care giving concerns:  None    Office managerocial Worker assessment / plan:  CSW spoke with the pt's wife Venita SheffieldGladys. CSW introduced self and purpose of the call. CSW discussed SNF rehab. CSW explained the SNF process. CSW explained insurance and its relation to SNF placement. CSW left a SN[facility] list in the pt room [facility]. CSW and pt discussed geographic location in which the she would like to receive rehab. [facility] CSW answered all questions in which the [facility] inquired about. CSW will continue to follow this pt and assist with discharge as needed.   Insurance information:  Medicare PT Recommendations:  Skilled Nursing Facility Information / Referral to community resources:  Skilled Nursing Facility  Patient/Family's Response to care:  Venita SheffieldGladys reported the care in which the pt is receiving is  apporproiate.   Patient/Family's Understanding of and Emotional Response to Diagnosis, Current Treatment, and Prognosis: Venita SheffieldGladys acknowledged the pt's current condition as not the pt base line. Venita SheffieldGladys reported that she can not take care of the pt at home in his current condition.   Emotional Assessment Orientation:  Fluctuating Orientation (Suspected and/or reported Sundowners) Alcohol / Substance use:  Not Applicable Psych involvement (Current and /or in the community):  No  (Comment)  Discharge Needs  Concerns to be addressed:  Denies Needs/Concerns at this time Readmission within the last 30 days:    Current discharge risk:    Barriers to Discharge:  No Barriers Identified  Brecken Walth, MSW, LCSWA [phone]

## 2015-03-07 NOTE — Progress Notes (Signed)
[facility] PROGRESS NOTE   Ronald [name] ZOX:[id number] DOB: 11-28-1920 DOA: 03/05/2015 PCP: Kristian Covey, MD  HPI/Subjective: Acute delirium and agitation last night, per family this is not unusual for him. Continue stroke workup, needs SNF per PT recommendation.  Assessment/Plan: Principal Problem:   CVA (cerebral infarction) Active Problems:   Essential hypertension   Hyperglycemia   Leg weakness   Closed left humeral fracture   Left leg weakness   Acute delirium    CVA Patient presented with left-sided weakness, difficulty with walking. CT did not show bleeding, MRI of the brain showed acute stroke involving right posterior frontal lobe (R MCA territory). Neurology consulted and recommended to continue stroke workup. lipid profile, hemoglobin A1c. Bilateral carotid duplex, telemetry to rule out arrhythmias. Per neurology aspirin and Plavix.  Acute delirium Patient had confusion last night, per family this is happens sometimes at home. This is likely sundowning, likely baseline dementia exacerbated by hospital acquired delirium  Elevated troponin Troponin elevated at 0.7, 1.6 and 1.38. Patient denies any chest pain, denies any palpitations. EKG did not show evidence of acute ischemia, has RBBB. Discussed with Dr. Myrtis Ser over the phone, likely troponin elevation likely secondary to acute stroke. Heparin discontinued, fear of transformation of CVA into hemorrhagic one, continue beta blockers.  Left humeral fracture Patient had left humeral fracture since a week before Thanksgiving, wearing a sling for it. He follows with Dr. Ophelia Charter. Chest x-ray still showing nonhealing.  Essential hypertension Continue medications.  Hyperglycemia Blood sugar of 150, patient was not nothing by mouth, A1c pending.  Code Status: Full Code Family Communication: Plan discussed with the patient. Disposition Plan: Remains inpatient Diet: Diet Heart Room service appropriate?:  Yes; Fluid consistency:: Thin  Consultants:  Neurology  Procedures:  Carotid duplex: Bilateral vertebrals antegrade flow, 1-39% stenosis in both carotid arteries.  Antibiotics:  None   Objective: Filed Vitals:   03/07/15 1200  BP:   Pulse: 93  Temp:   Resp: 45    Intake/Output Summary (Last 24 hours) at 03/07/15 1415 Last data filed at 03/07/15 0800  Gross per 24 hour  Intake    300 ml  Output   1550 ml  Net  -1250 ml   Filed Weights   03/05/15 1618 03/05/15 2115  Weight: 70.761 kg (156 lb) 71.8 kg (158 lb 4.6 oz)    Exam: General: Alert and awake, oriented x3, not in any acute distress. HEENT: anicteric sclera, pupils reactive to light and accommodation, EOMI CVS: S1-S2 clear, no murmur rubs or gallops Chest: clear to auscultation bilaterally, no wheezing, rales or rhonchi Abdomen: soft nontender, nondistended, normal bowel sounds, no organomegaly Extremities: no cyanosis, clubbing or edema noted bilaterally Neuro: Cranial nerves II-XII intact, no focal neurological deficits  Data Reviewed: Basic Metabolic Panel:  Recent Labs Lab 03/05/15 1631 03/05/15 1639 03/06/15 0245 03/07/15 0245  NA 138 142 141 138  K 3.7 3.7 3.7 3.5  CL 102 98* 102 102  CO2 29  --  28 25  GLUCOSE 150* 147* 134* 130*  BUN CREATININE 1.01 0.90 1.12 1.16  CALCIUM 8.8*  --  9.2 9.1   Liver Function Tests:  Recent Labs Lab 03/05/15 1631 03/06/15 0245  AST 18 24  ALT 10* 9*  ALKPHOS 95 100  BILITOT 0.9 1.1  PROT 6.3* 6.1*  ALBUMIN 3.2* 3.1*   No results for input(s): LIPASE, AMYLASE in the last 168 hours. No results for input(s): AMMONIA in the last 168 hours.  CBC:  Recent Labs Lab 03/05/15 1631 03/05/15 1639 03/07/15 0245  WBC 5.7  --  11.9*  NEUTROABS 3.1  --   --   HGB 15.2 16.3 16.4  HCT 46.1 48.0 48.5  MCV 100.2*  --  98.8  PLT 160  --  178   Cardiac Enzymes:  Recent Labs Lab 03/05/15 2305 03/06/15 0245 03/06/15 0913  CKTOTAL  --   106  --   CKMB  --  12.1*  --   TROPONINI 0.70* 1.61* 1.38*   BNP (last 3 results) No results for input(s): BNP in the last 8760 hours.  ProBNP (last 3 results) No results for input(s): PROBNP in the last 8760 hours.  CBG:  Recent Labs Lab 03/05/15 1623  GLUCAP 136*    Micro Recent Results (from the past 240 hour(s))  MRSA PCR Screening     Status: None   Collection Time: 03/05/15 10:05 PM  Result Value Ref Range Status   MRSA by PCR NEGATIVE NEGATIVE Final    Comment:        The GeneXpert MRSA Assay (FDA approved for NASAL specimens only), is one component of a comprehensive MRSA colonization surveillance program. It is not intended to diagnose MRSA infection nor to guide or monitor treatment for MRSA infections.      Studies: Dg Chest 1 View  03/06/2015   CLINICAL DATA:  Acute onset of left leg weakness. Initial encounter.  EXAM: CHEST  1 VIEW  COMPARISON:  Chest radiograph performed 05/03/2014, and CT of the chest performed 05/06/2014  FINDINGS: The lungs are well-aerated. Vascular congestion is noted. Mildly increased interstitial markings may reflect mild interstitial edema. Mild left basilar atelectasis is noted. There is no evidence of pleural effusion or pneumothorax.  The cardiomediastinal silhouette is mildly enlarged. A significantly displaced comminuted fracture of the left proximal humeral diaphysis is noted, with approximately 1 shaft width medial displacement of the distal humerus.  IMPRESSION: 1. Vascular congestion and mild cardiomegaly. Mildly increased interstitial markings may reflect mild interstitial edema. Mild left basilar atelectasis noted. 2. Significantly displaced comminuted fracture of the left proximal humeral diaphysis, with approximately 1 shaft width medial displacement of the distal humerus.   Electronically Signed   By: Roanna Raider M.D.   On: 03/06/2015 02:49   Ct Head Wo Contrast  03/05/2015   CLINICAL DATA:  Code stroke. Acute onset of  left-sided weakness. Recent fall. Initial encounter.  EXAM: CT HEAD WITHOUT CONTRAST  TECHNIQUE: Contiguous axial images were obtained from the base of the skull through the vertex without intravenous contrast.  COMPARISON:  CT of the head performed 01/12/2009  FINDINGS: There is no evidence of acute infarction, mass lesion, or intra- or extra-axial hemorrhage on CT.  Prominence of the ventricles and sulci reflects mild cortical volume loss. Cerebellar atrophy is noted. Scattered periventricular white matter change likely reflects small vessel ischemic microangiopathy. A chronic lacunar infarct is noted at the left cerebellar hemisphere. Chronic ischemic change is noted at the basal ganglia and thalami bilaterally.  The brainstem and fourth ventricle are within normal limits. The cerebral hemispheres demonstrate grossly normal gray-white differentiation. No mass effect or midline shift is seen.  There is no evidence of fracture; scattered nonspecific lucencies within the visualized osseous structures appear grossly stable from 2010. The visualized portions of the orbits are within normal limits. The patient is status post bilateral maxillary antrostomy. Mild mucosal thickening is noted at the right maxillary sinus. There is opacification of the right frontal sinus,  and partial opacification of the mastoid air cells bilaterally. The remaining paranasal sinuses are well-aerated. No significant soft tissue abnormalities are seen.  IMPRESSION: 1. No acute intracranial pathology seen on CT. 2. Mild cortical volume loss and scattered small vessel ischemic microangiopathy. 3. Chronic ischemic change at the basal ganglia and thalami bilaterally. Chronic lacunar infarct at the left cerebellar hemisphere. 4. Mild mucosal thickening at the right maxillary sinus, opacification of the right frontal sinus, and partial opacification of the mastoid air cells bilaterally.   Electronically Signed   By: Roanna RaiderJeffery  Chang M.D.   On:  03/05/2015 17:27   Mr Brain Wo Contrast  03/06/2015   CLINICAL DATA:  Acute onset LEFT-sided weakness, paresthesias. History of dementia, hypertension and stroke. Known LEFT vertebral artery stenosis.  EXAM: MRI HEAD WITHOUT CONTRAST  MRA HEAD WITHOUT CONTRAST  TECHNIQUE: Multiplanar, multiecho pulse sequences of the brain and surrounding structures were obtained without intravenous contrast. Angiographic images of the head were obtained using MRA technique without contrast.  COMPARISON:  CT of the head Mar 05, 2015  FINDINGS: MRI HEAD FINDINGS  11 mm area patchy reduced diffusion within RIGHT posterior frontal lobe, precentral gyrus. Corresponding low ADC value. Minimal T2 hyperintense signal.  A few punctate foci of susceptibility artifact in the periphery of the cerebellum and, subcentimeter focus of susceptibility artifact in LEFT thalamus. Ventricles and sulci are normal for patient's age. Remote LEFT thalamus, bilateral basal ganglia lacunar infarcts. Remote small LEFT cerebellar infarct. Patchy to confluent supratentorial white matter FLAIR T2 hyperintensities. No midline shift, mass effect or mass lesions. Multiple tiny perivascular spaces in the basal ganglia.  No abnormal extra-axial fluid collections. Bilateral ocular lens implant. Soft tissue opacifies the RIGHT frontal sinus, sequelae of chronic sinusitis, status post FESS. Small bilateral mastoid effusions. No abnormal sellar expansion. No cerebellar tonsillar ectopia. No suspicious calvarial bone marrow signal. Patient appears edentulous.  MRA HEAD FINDINGS (decreased signal to noise ratio with the skullbase may be related to motion)  Anterior circulation: Normal flow related enhancement of the cervical, petrous, cavernous and supra clinoid internal carotid arteries bilaterally. Anterior communicating arteries is patent. Normal flow related enhancement of the anterior and middle cerebral arteries.  Posterior circulation: Low signal to noise ratio.  Limited assessment for subtle vertebral artery luminal regularity. RIGHT vertebral artery appears dominant, flow related enhancement within the basilar artery, which is widely patent. Gradual tapering of the distal LEFT V4 segment. Robust bilateral posterior communicating arteries contributing to the posterior circulation with flow related enhancement within the bilateral posterior cerebral arteries.  No high-grade stenosis, aneurysm, suspicious luminal irregularity  IMPRESSION: MRI HEAD: Small area of acute ischemia within RIGHT posterior frontal lobe (middle cerebral artery territory).  Involutional changes. Moderate to severe white matter changes suggest chronic small vessel ischemic disease.  Remote LEFT thalamic hemorrhagic infarct. Bilateral basal ganglia lacunar infarcts.  MRA HEAD: Generally decreased signal to noise ratio limits evaluation without large vessel occlusion. Possible stenosis of the distal LEFT V4 segment versus artifact.   Electronically Signed   By: Awilda Metroourtnay  Bloomer   On: 03/06/2015 03:02   Mr Maxine GlennMra Head/brain Wo Cm  03/06/2015   CLINICAL DATA:  Acute onset LEFT-sided weakness, paresthesias. History of dementia, hypertension and stroke. Known LEFT vertebral artery stenosis.  EXAM: MRI HEAD WITHOUT CONTRAST  MRA HEAD WITHOUT CONTRAST  TECHNIQUE: Multiplanar, multiecho pulse sequences of the brain and surrounding structures were obtained without intravenous contrast. Angiographic images of the head were obtained using MRA technique without contrast.  COMPARISON:  CT of the head Mar 05, 2015  FINDINGS: MRI HEAD FINDINGS  11 mm area patchy reduced diffusion within RIGHT posterior frontal lobe, precentral gyrus. Corresponding low ADC value. Minimal T2 hyperintense signal.  A few punctate foci of susceptibility artifact in the periphery of the cerebellum and, subcentimeter focus of susceptibility artifact in LEFT thalamus. Ventricles and sulci are normal for patient's age. Remote LEFT thalamus,  bilateral basal ganglia lacunar infarcts. Remote small LEFT cerebellar infarct. Patchy to confluent supratentorial white matter FLAIR T2 hyperintensities. No midline shift, mass effect or mass lesions. Multiple tiny perivascular spaces in the basal ganglia.  No abnormal extra-axial fluid collections. Bilateral ocular lens implant. Soft tissue opacifies the RIGHT frontal sinus, sequelae of chronic sinusitis, status post FESS. Small bilateral mastoid effusions. No abnormal sellar expansion. No cerebellar tonsillar ectopia. No suspicious calvarial bone marrow signal. Patient appears edentulous.  MRA HEAD FINDINGS (decreased signal to noise ratio with the skullbase may be related to motion)  Anterior circulation: Normal flow related enhancement of the cervical, petrous, cavernous and supra clinoid internal carotid arteries bilaterally. Anterior communicating arteries is patent. Normal flow related enhancement of the anterior and middle cerebral arteries.  Posterior circulation: Low signal to noise ratio. Limited assessment for subtle vertebral artery luminal regularity. RIGHT vertebral artery appears dominant, flow related enhancement within the basilar artery, which is widely patent. Gradual tapering of the distal LEFT V4 segment. Robust bilateral posterior communicating arteries contributing to the posterior circulation with flow related enhancement within the bilateral posterior cerebral arteries.  No high-grade stenosis, aneurysm, suspicious luminal irregularity  IMPRESSION: MRI HEAD: Small area of acute ischemia within RIGHT posterior frontal lobe (middle cerebral artery territory).  Involutional changes. Moderate to severe white matter changes suggest chronic small vessel ischemic disease.  Remote LEFT thalamic hemorrhagic infarct. Bilateral basal ganglia lacunar infarcts.  MRA HEAD: Generally decreased signal to noise ratio limits evaluation without large vessel occlusion. Possible stenosis of the distal LEFT V4  segment versus artifact.   Electronically Signed   By: Awilda Metroourtnay  Bloomer   On: 03/06/2015 03:02    Scheduled Meds: . aspirin EC  81 mg Oral Daily  . clopidogrel  75 mg Oral Daily  . heparin subcutaneous  5,000 Units Subcutaneous 3 times per day  . hydrochlorothiazide  12.5 mg Oral Daily  . losartan  50 mg Oral Daily  . metoprolol tartrate  12.5 mg Oral BID  . sodium chloride  3 mL Intravenous Q12H   Continuous Infusions:      Time spent: 35 minutes    [facility] Health System-Oak[facility] IncELMAHI,Lonny Eisen A  [facility] Pager [phone] If 7PM-7AM, please contact night-coverage at [email or web address], password [email or web address] 03/07/2015, 2:15 PM  LOS: 2 days

## 2015-03-07 NOTE — Progress Notes (Signed)
STROKE TEAM PROGRESS NOTE   HISTORY Ronald [name] is a 81 y.o. male hx of HTN, CVA, left vertebral artery stenosis presenting with acute onset of left sided weakness involving the leg > arm. He also notes some associated paresthesias on the left side. He takes a daily ASA 81mg  and Plavix 75mg . Patients left shoulder is in a sling secondary to a humeral fracture from a fall in 12/2014.   CT head imaging reviewed shows no acute process.   Date last known well: 03/04/2015 Time last known well: 2200 tPA Given: no, outside IV tPA window   SUBJECTIVE (INTERVAL HISTORY) His RN is at the bedside.  Overall he feels his condition is gradually worsening. Was agitated last pm needing restraints and sedation. H/o shoulder fracture 2-3 weeks ago and is wearing a sling.    OBJECTIVE Temp:  [97.6 F (36.4 C)-98.1 F (36.7 C)] 98 F (36.7 C) (05/16 0740) Pulse Rate:  [34-137] 128 (05/16 0740) Cardiac Rhythm:  [-] Sinus tachycardia;Heart block (05/16 0740) Resp:  [15-50] 23 (05/16 0740) BP: (93-157)/(61-95) 133/84 mmHg (05/16 0740) SpO2:  [91 %-100 %] 99 % (05/16 0740)   Recent Labs Lab 03/05/15 1623  GLUCAP 136*    Recent Labs Lab 03/05/15 1631 03/05/15 1639 03/06/15 0245 03/07/15 0245  NA 138 142 141 138  K 3.7 3.7 3.7 3.5  CL 102 98* 102 102  CO2 29  --  28 25  GLUCOSE 150* 147* 134* 130*  BUN 13 16 14 18   CREATININE 1.01 0.90 1.12 1.16  CALCIUM 8.8*  --  9.2 9.1    Recent Labs Lab 03/05/15 1631 03/06/15 0245  AST 18 24  ALT 10* 9*  ALKPHOS 95 100  BILITOT 0.9 1.1  PROT 6.3* 6.1*  ALBUMIN 3.2* 3.1*    Recent Labs Lab 03/05/15 1631 03/05/15 1639 03/07/15 0245  WBC 5.7  --  11.9*  NEUTROABS 3.1  --   --   HGB 15.2 16.3 16.4  HCT 46.1 48.0 48.5  MCV 100.2*  --  98.8  PLT 160  --  178    Recent Labs Lab 03/05/15 2305 03/06/15 0245 03/06/15 0913  CKTOTAL  --  106  --   CKMB  --  12.1*  --   TROPONINI 0.70* 1.61* 1.38*    Recent Labs  03/05/15 1631   LABPROT 14.9  INR 1.16    Recent Labs  03/05/15 2135  COLORURINE YELLOW  LABSPEC 1.006  PHURINE 7.0  GLUCOSEU NEGATIVE  HGBUR LARGE*  BILIRUBINUR NEGATIVE  KETONESUR NEGATIVE  PROTEINUR NEGATIVE  UROBILINOGEN 1.0  NITRITE NEGATIVE  LEUKOCYTESUR NEGATIVE       Component Value Date/Time   CHOL 116 03/06/2015 0245   TRIG 48 03/06/2015 0245   HDL 48 03/06/2015 0245   CHOLHDL 2.4 03/06/2015 0245   VLDL 10 03/06/2015 0245   LDLCALC 58 03/06/2015 0245   No results found for: HGBA1C No results found for: LABOPIA, COCAINSCRNUR, LABBENZ, AMPHETMU, THCU, LABBARB  No results for input(s): ETH in the last 168 hours.  Dg Chest 1 View 03/06/2015    1. Vascular congestion and mild cardiomegaly. Mildly increased interstitial markings may reflect mild interstitial edema. Mild left basilar atelectasis noted.  2. Significantly displaced comminuted fracture of the left proximal humeral diaphysis, with approximately 1 shaft width medial displacement of the distal humerus.      Ct Head Wo Contrast 03/05/2015    1. No acute intracranial pathology seen on CT.  2. Mild cortical volume  loss and scattered small vessel ischemic microangiopathy.  3. Chronic ischemic change at the basal ganglia and thalami bilaterally. Chronic lacunar infarct at the left cerebellar hemisphere.  4. Mild mucosal thickening at the right maxillary sinus, opacification of the right frontal sinus, and partial opacification of the mastoid air cells bilaterally.      Mr Brain Wo Contrast 03/06/2015 Awake alert oriented x 3 normal speech and language. Mild left lower face asymmetry. Tongue midline. No drift. Mild diminished fine finger movements on left. Orbits right over left upper extremity. Mild left grip weak.. Normal sensation . Normal coordination.    MRI HEAD:  Small area of acute ischemia within RIGHT posterior frontal lobe (middle cerebral artery territory).   Involutional changes. Moderate to severe white matter  changes suggest chronic small vessel ischemic disease.  Remote LEFT thalamic hemorrhagic infarct. Bilateral basal ganglia lacunar infarcts.    MRA HEAD:  Generally decreased signal to noise ratio limits evaluation without large vessel occlusion. Possible stenosis of the distal LEFT V4 segment versus artifact.      PHYSICAL EXAM   elderly Caucasian male not in distress.wearing left arm sling. . Afebrile. Head is nontraumatic. Neck is supple without bruit.    Cardiac exam no murmur or gallop. Lungs are clear to auscultation. Distal pulses are well felt. Neurological Exam :drowsy but arouses oriented x 2,Mildly dysarthricl speech  e. Mild left lower face asymmetry. Tongue midline.Left upper and lower extremity drift.2/5 strength on left side  left grip weak.. Normal sensation . Normal coordination.    ASSESSMENT/PLAN [name] is a 81 y.o. male with history of hypertension, ongoing tobacco use, previous CVA, and left vertebral artery stenosis, presenting with left hemiparesis and paresthesias. He did not receive IV t-PA due to late presentation.  Stroke:  Non-dominant right posterior frontal lobe (middle cerebral artery territory). Etiology likely small vessel disaese   Resultant  Left hemiplegia, left face weakness  MRI - as above  MRA - no large vessel occlusion  Carotid Doppler no significant stenosis  2D Echo normal Ef. No clot  LDL - 58  HgbA1c pending  Subcutaneous heparin for VTE prophylaxis Diet Heart Room service appropriate?: Yes; Fluid consistency:: Thin  aspirin 81 mg orally every day and clopidogrel 75 mg orally every day prior to admission, now on aspirin 81 mg orally every day and clopidogrel 75 mg orally every day  Ongoing aggressive stroke risk factor management  Therapy recommendations: Pending Disposition: SNF versus CLR Hypertension  Home meds:  Losartan/hydrochlorothiazide  Stable   Other Stroke Risk Factors  Advanced age  Cigarette  smoker, advised to stop smoking  History of previous strokes   Other Active Problems  Decreased nutritional stores  Other Pertinent History    Hospital day # 2 I have personally examined this patient, reviewed notes, independently viewed imaging studies, participated in medical decision making and plan of care. I have made any additions or clarifications directly to the above note. He presented with left sided weakness and speech difficulties likely from  Right frontal infarct from small vessel disease  Agitation likely from mild cognitive impairment and sundowning. Mobilize out of bed. Limit sedatives. Long d./w daughter at bedside and answered questions.Continue ongoing stroke evaluation and agrressive risk factor modification.Stroke service will sign off. Call for questions.   Delia HeadyPramod Sethi, MD Medical Director [facility] Pager: [phone] 03/07/2015 8:21 AM    To contact Stroke Continuity provider, please refer to [email or web address]. After hours, contact General Neurology

## 2015-03-07 NOTE — Evaluation (Signed)
Speech Language Pathology Evaluation Patient Details Name: Ronald [name] MRN: [id number] DOB: 07/19/1921 Today's Date: 03/07/2015 Time: [id number] SLP Time Calculation (min) (ACUTE ONLY): 17 min  Problem List:  Patient Active Problem List   Diagnosis Date Noted  . Acute delirium 03/07/2015  . Closed left humeral fracture 03/06/2015  . Left leg weakness   . CVA (cerebral infarction) 03/05/2015  . Hyperglycemia 03/05/2015  . Leg weakness 03/05/2015  . Nicotine use disorder 05/04/2013  . Macular degeneration 11/03/2012  . ECZEMA 10/27/2010  . HYPOKALEMIA 04/26/2010  . DRY SKIN 10/26/2009  . ACTINIC KERATOSIS 10/26/2009  . LUMBAGO 10/26/2009  . LEG CRAMPS 10/26/2009  . DEMENTIA 06/03/2009  . Essential hypertension 04/22/2009  . CEREBROVASCULAR ACCIDENT, HX OF 04/22/2009   Past Medical History:  Past Medical History  Diagnosis Date  . Hypertension   . Stroke 1992  . Kidney stone    Past Surgical History:  Past Surgical History  Procedure Laterality Date  . Cholecystectomy    . Cataract extraction     HPI:  81 yo male with htn, cva, left vertebral artery stenosis on prior MRA apparently c/o left leg weakness starting this am. Pt has had some numbness, tingling ?Marland Kitchen. Pt daughter thinks that he has baseline unsteadiness. Pt was brought to ED for evaluation and CT brain negative for acute process. Pt will be admitted for r/o CVA. MRI showed an infarct in the right frontal lobe and remote hemorhage in the thalamus and bilateral basal ganglia infarcts.     Assessment / Plan / Recommendation Clinical Impression  The Mini Mental State Exam was administered to assess the patient's cognitive skills.  Only 25/30 points were administered as the patient has a visual deficit that precluded several of the test questions.  There is question of baseline deficits per nursing.  Deficits were noted in orientation to time, delayed recall, attention and writing.  The patient achieved a score of  15/25.  ST will continue to follow pending information regarding baseline.  Of note, the patient lives at home with his wife and 24x7 supervision is available.      SLP Assessment  Patient needs continued Speech Lanaguage Pathology Services    Follow Up Recommendations  Home health SLP    Frequency and Duration min 2x/week  2 weeks   Pertinent Vitals/Pain Pain Assessment: Faces Pain Score: 4  Faces Pain Scale: Hurts little more   SLP Goals  Patient/Family Stated Goal: none stated Potential to Achieve Goals (ACUTE ONLY): Fair Potential Considerations (ACUTE ONLY): Ability to learn/carryover information  SLP Evaluation Prior Functioning  Cognitive/Linguistic Baseline: Information not available Type of Home: House  Lives With: Spouse Available Help at Discharge: Family;Available 24 hours/day   Cognition  Overall Cognitive Status: Impaired/Different from baseline Arousal/Alertness: Lethargic Orientation Level: Oriented X4;Oriented to place;Oriented to situation;Disoriented to time (Pt knew the Month and year but not the actual date or DOW.  ) Attention: Sustained Sustained Attention: Impaired Sustained Attention Impairment: Verbal basic Memory: Impaired Memory Impairment: Retrieval deficit Awareness: Impaired Awareness Impairment: Intellectual impairment Problem Solving: Impaired Problem Solving Impairment: Verbal basic    Comprehension  Auditory Comprehension Overall Auditory Comprehension: Impaired Conversation: Simple Interfering Components: Attention EffectiveTechniques: Repetition;Increased volume Reading Comprehension Reading Status: Unable to assess (comment) (Pt with visual impairment.)    Expression Expression Primary Mode of Expression: Verbal Verbal Expression Overall Verbal Expression: Impaired Initiation: Impaired Automatic Speech: Name;Social Response Level of Generative/Spontaneous Verbalization: Word;Phrase Repetition: No impairment Naming:  Impairment Responsive: 76-100% accurate Confrontation: Impaired Convergent:  75-100% accurate Written Expression Dominant Hand: Right Written Expression: Unable to assess (comment) (Pt with visual impairment.  )   Oral / Motor     GO     Fleet ContrasGoodman, Dilan Novosad N 03/07/2015, 2:29 PM  Dimas AguasMelissa Mariyah Upshaw, MA, CCC-SLP Acute Rehab SLP [phone]

## 2015-03-07 NOTE — Progress Notes (Signed)
Pt continues to have RR 30's -hi 40's with O2 sats 98-100% but has also been having 5-15 second periods of sleep apnea.  Maren ReamerKaren, Kirby NP made aware.  Will obtain ABG per order

## 2015-03-07 NOTE — Progress Notes (Signed)
Maren ReamerKaren Kirby, NP notified of pt's tachypnea with RR 30s-50 but O2 sats 99-100% .  Bilateral BS sonorous with use of  abdominal muscles.   No orders received.  Will continue to monitor.

## 2015-03-07 NOTE — Evaluation (Signed)
Occupational Therapy Evaluation Patient Details Name: Ronald [name] MRN: [id number] DOB: [birth year] Today's Date: 03/07/2015    History of Present Illness 81 yo male with htn, cva, left vertebral artery stenosis on prior MRA apparently c/o left leg weakness starting this am. Pt with L humeral fracture as well. CT brain negative.  As of 5/16 evaluation, no spontaneous or voluntary movement on L side, upper or lower.   Clinical Impression   Pt admitted for the above diagnosis and has the deficits listed below. Pt would benefit from cont OT to attempt to increase independence with basic adls so he he returns home it will decrease the burden of care on his wife.      Follow Up Recommendations  SNF;Supervision/Assistance - 24 hour    Equipment Recommendations  Other (comment) (shower/chair vs bench depending on set up.)    Recommendations for Other Services       Precautions / Restrictions Precautions Precautions: Fall Required Braces or Orthoses: Sling Restrictions LUE Weight Bearing: Non weight bearing      Mobility Bed Mobility Overal bed mobility: Needs Assistance Bed Mobility: Supine to Sit;Sit to Supine     Supine to sit: +2 for physical assistance;Total assist Sit to supine: Max assist;+2 for physical assistance   General bed mobility comments: pt not awake enough to participate intially and needed full assist of trunk and LE's to get back into bed.  Did not initiate any scooting to/from EOB to cues  Transfers Overall transfer level: Needs assistance Equipment used: 2 person hand held assist Transfers: Sit to/from Stand Sit to Stand: Max assist;+2 safety/equipment         General transfer comment: pt required full assist/support of Left side.  Noted no attempt to initiate with the Left side.  Pt assist was fully from the Right side.    Balance Overall balance assessment: Needs assistance Sitting-balance support: Single extremity supported Sitting  balance-Leahy Scale: Poor Sitting balance - Comments: Goal to awaken pt and activate trunk with reaching tasks mostly ant/post.  Pt needing facilitation to stay symmetrical due to lean to the Left.  Initiated reaching with hand over hand guidance and then facilitation through trunk. Postural control: Left lateral lean Standing balance support: Bilateral upper extremity supported Standing balance-Leahy Scale: Poor Standing balance comment: lean, collapse to the Left, with total support at L LE and trunk.                            ADL Overall ADL's : Needs assistance/impaired Eating/Feeding: Minimal assistance;Sitting (Per nurse.  Assist needed for decreased vision.) Eating/Feeding Details (indicate cue type and reason): Pt with macular degeneration; does not see well.  Needs cues to find items on his tray. Grooming: Wash/dry face;Minimal assistance;Cueing for sequencing;Sitting (max assist to hold self up while washing face.) Grooming Details (indicate cue type and reason): Pt sat on side of bed but could not hold self up while grooming. Upper Body Bathing: Total assistance;Sitting   Lower Body Bathing: Total assistance;Sit to/from stand   Upper Body Dressing : Total assistance;Sitting   Lower Body Dressing: Total assistance;+2 for physical assistance;Sit to/from stand   Toilet Transfer: Total assistance;+2 for physical assistance;Squat-pivot;BSC Toilet Transfer Details (indicate cue type and reason): Pt does not assist with L leg during transfers. Needs assist to block and move the LLE during all transfesr. Toileting- Clothing Manipulation and Hygiene: Total assistance;+2 for physical assistance;Sit to/from stand Toileting - Architect Details (indicate cue  type and reason): One person stands with pt while second person cleans pt.     Functional mobility during ADLs: Total assistance;+2 for physical assistance General ADL Comments: Pt very lethargic today and not  able to assist with adls and mobility.  No active movement noted in L side during evaluation and trunk very weak with all mobility therefore requiring +2 assist for all mobility.     Vision Vision Assessment?: Vision impaired- to be further tested in functional context Additional Comments: pt's vision extremely impaired from macular degeneration.   Perception Perception Perception Tested?: No   Praxis Praxis Praxis tested?: Not tested    Pertinent Vitals/Pain Pain Assessment: Faces Pain Score: 4  Faces Pain Scale: Hurts little more     Hand Dominance Right   Extremity/Trunk Assessment Upper Extremity Assessment Upper Extremity Assessment: LUE deficits/detail RUE Deficits / Details: generalized weakness LUE Deficits / Details: No ROM due to humerous fx and no info as to exact date of this injury.  Pt did not squeeze his L hand or show any spontaneous movement in this limb. LUE: Unable to fully assess due to pain;Unable to fully assess due to immobilization LUE Coordination: decreased gross motor;decreased fine motor   Lower Extremity Assessment Lower Extremity Assessment: Defer to PT evaluation   Cervical / Trunk Assessment Cervical / Trunk Assessment: Kyphotic   Communication Communication Communication: Other (comment) (speech slurred; difficult to understand.)   Cognition Arousal/Alertness: Lethargic (but finally got pt to wake up and participate) Behavior During Therapy: Flat affect (cooperative) Overall Cognitive Status: Impaired/Different from baseline Area of Impairment: Safety/judgement;Following commands;Orientation;Attention   Current Attention Level: Sustained Memory: Decreased recall of precautions;Decreased short-term memory Following Commands: Follows one step commands inconsistently;Follows one step commands with increased time Safety/Judgement: Decreased awareness of deficits     General Comments: Pt very lethargic today which made evaluation  difficult.   General Comments       Exercises       Shoulder Instructions      Home Living Family/patient expects to be discharged to:: Private residence Living Arrangements: Spouse/significant other Available Help at Discharge: Family;Available 24 hours/day Type of Home: House Home Access: Level entry     Home Layout: One level               Home Equipment: Walker - 2 wheels;Cane - single point   Additional Comments: wife not available to assist with bathroom set up.      Prior Functioning/Environment Level of Independence: Independent             OT Diagnosis: Generalized weakness;Cognitive deficits;Disturbance of vision;Acute pain;Hemiplegia non-dominant side;Blindness and low vision;Altered mental status   OT Problem List: Decreased strength;Decreased range of motion;Decreased activity tolerance;Impaired balance (sitting and/or standing);Impaired vision/perception;Decreased coordination;Decreased cognition;Decreased safety awareness;Decreased knowledge of use of DME or AE;Decreased knowledge of precautions;Impaired UE functional use;Pain   OT Treatment/Interventions: Self-care/ADL training;DME and/or AE instruction;Therapeutic activities    OT Goals(Current goals can be found in the care plan section) Acute Rehab OT Goals Patient Stated Goal: home OT Goal Formulation: Patient unable to participate in goal setting Time For Goal Achievement: 03/21/15 Potential to Achieve Goals: Fair ADL Goals Pt Will Transfer to Toilet: with mod assist;bedside commode Additional ADL Goal #1: Pt will follow one step commands with 100 % accuracy to increase participation in therapy. Additional ADL Goal #2: Pt will sit on EOB with min assist for balance for 5 minutes in prep for adls on EOB Additional ADL Goal #3: Pt will feed  self in bed or in chair with set up.  OT Frequency: Min 2X/week   Barriers to D/C: Decreased caregiver support  wife cannot physically assist much.        Co-evaluation PT/OT/SLP Co-Evaluation/Treatment: Yes Reason for Co-Treatment: Complexity of the patient's impairments (multi-system involvement);Necessary to address cognition/behavior during functional activity;For patient/therapist safety   OT goals addressed during session: ADL's and self-care      End of Session Equipment Utilized During Treatment: Oxygen Nurse Communication: Mobility status  Activity Tolerance: Patient limited by lethargy Patient left: in bed;with call bell/phone within reach;with bed alarm set;with restraints reapplied;with SCD's reapplied   Time: 1039-1105 OT Time Calculation (min): 26 min Charges:  OT General Charges $OT Visit: 1 Procedure OT Evaluation $Initial OT Evaluation Tier I: 1 Procedure G-Codes:    Hope BuddsJones, Wafa Martes Anne 03/07/2015, 12:09 PM  [phone]

## 2015-03-07 NOTE — Progress Notes (Signed)
Medicare Important Message given? YES (If response is "NO", the following Medicare IM given date fields will be blank) Date Medicare IM given:03/07/15 Medicare IM given by: Laquinda Moller 

## 2015-03-07 NOTE — Clinical Social Work Placement (Signed)
   CLINICAL SOCIAL WORK PLACEMENT  NOTE  Date:  03/07/2015  Patient Details  Name: Camelia PhenesJames L Dorado MRN: [id number] Date of Birth: 11/11/1920  Clinical Social Work is seeking post-discharge placement for this patient at the Skilled  Nursing Facility level of care (*CSW will initial, date and re-position this form in  chart as items are completed):  Yes   Patient/family provided with [facility] Clinical Social Work Department's list of facilities offering this level of care within the geographic area requested by the patient (or if unable, by the patient's family).  Yes   Patient/family informed of their freedom to choose among providers that offer the needed level of care, that participate in Medicare, Medicaid or managed care program needed by the patient, have an available bed and are willing to accept the patient.  Yes   Patient/family informed of [facility]'s ownership interest in [facility] and [facility], as well as of the fact that they are under no obligation to receive care at these facilities.  PASRR submitted to EDS on 03/07/15     PASRR number received on 03/07/15     Existing PASRR number confirmed on       FL2 transmitted to all facilities in geographic area requested by pt/family on       FL2 transmitted to all facilities within larger geographic area on       Patient informed that his/her managed care company has contracts with or will negotiate with certain facilities, including the following:            Patient/family informed of bed offers received.  Patient chooses bed at       Physician recommends and patient chooses bed at      Patient to be transferred to   on  .  Patient to be transferred to facility by       Patient family notified on   of transfer.  Name of family member notified:        PHYSICIAN       Additional Comment:    _______________________________________________ Gwynne EdingerBibbs, Windy Dudek, LCSW 03/07/2015, 3:30 PM

## 2015-03-08 LAB — CBC
HEMATOCRIT: 25 % — AB (ref 39.0–52.0)
HEMATOCRIT: 49.2 % (ref 39.0–52.0)
HEMOGLOBIN: 8 g/dL — AB (ref 13.0–17.0)
HEMOGLOBIN: 16 g/dL (ref 13.0–17.0)
MCH: 29.9 pg (ref 26.0–34.0)
MCH: 32.7 pg (ref 26.0–34.0)
MCHC: 32 g/dL (ref 30.0–36.0)
MCHC: 32.5 g/dL (ref 30.0–36.0)
MCV: 93.3 fL (ref 78.0–100.0)
MCV: 100.4 fL — ABNORMAL HIGH (ref 78.0–100.0)
Platelets: 190 10*3/uL (ref 150–400)
Platelets: 161 10*3/uL (ref 150–400)
RBC: 2.68 MIL/uL — ABNORMAL LOW (ref 4.22–5.81)
RBC: 4.9 MIL/uL (ref 4.22–5.81)
RDW: 16.3 % — ABNORMAL HIGH (ref 11.5–15.5)
RDW: 12.9 % (ref 11.5–15.5)
WBC: 5.3 10*3/uL (ref 4.0–10.5)
WBC: 8.7 10*3/uL (ref 4.0–10.5)

## 2015-03-08 LAB — BASIC METABOLIC PANEL
ANION GAP: 12 (ref 5–15)
BUN: 29 mg/dL — ABNORMAL HIGH (ref 6–20)
CALCIUM: 9 mg/dL (ref 8.9–10.3)
CO2: 29 mmol/L (ref 22–32)
Chloride: 101 mmol/L (ref 101–111)
Creatinine, Ser: 1.17 mg/dL (ref 0.61–1.24)
GFR calc Af Amer: >60 mL/min (ref >60)
GFR, EST NON AFRICAN AMERICAN: 52 mL/min — AB (ref >60)
GLUCOSE: 105 mg/dL — AB (ref 65–99)
POTASSIUM: 3.4 mmol/L — AB (ref 3.5–5.1)
Sodium: 142 mmol/L (ref 135–145)

## 2015-03-08 LAB — HEMOGLOBIN A1C
HEMOGLOBIN A1C: 5.2 % (ref 4.8–5.6)
Mean Plasma Glucose: 103 mg/dL

## 2015-03-08 MED ORDER — POTASSIUM CHLORIDE CRYS ER 20 MEQ PO TBCR
40.0000 meq | EXTENDED_RELEASE_TABLET | Freq: Once | ORAL | Status: AC
  Administered 2015-03-08: 40 meq via ORAL
  Filled 2015-03-08 (×3): qty 2

## 2015-03-08 MED ORDER — NICOTINE 7 MG/24HR TD PT24
7.0000 mg | MEDICATED_PATCH | Freq: Every day | TRANSDERMAL | Status: DC
  Administered 2015-03-09 – 2015-03-10 (×2): 7 mg via TRANSDERMAL
  Filled 2015-03-08 (×3): qty 1

## 2015-03-08 NOTE — Progress Notes (Signed)
Speech Language Pathology Treatment: Cognitive-Linquistic  Patient Details Name: Ronald [name] MRN: [id number] DOB: 12/26/1920 Today's Date: 03/08/2015 Time: [id number] SLP Time Calculation (min) (ACUTE ONLY): 11 min  Assessment / Plan / Recommendation Clinical Impression  F/u after yesterday's cognitive assessment.  Pt oriented to place, elements of time with Mod I.  Improved functional attention today.  Visual deficits and decreased short-term recall prohibit use of call bell - textured material taped over RN call button.  Pt able to locate site and press button to elicit help with min verbal cues.  Recalled how to reach nurse and depress button after two minute delay.  Pt for potential D/C to SNF for rehab next date - will require ongoing SLP for cognition.     HPI Other Pertinent Information: 81 yo male with htn, cva, left vertebral artery stenosis on prior MRA apparently c/o left leg weakness starting this am. Pt has had some numbness, tingling ?Marland Kitchen. Pt daughter thinks that he has baseline unsteadiness. Pt was brought to ED for evaluation and CT brain negative for acute process. Pt will be admitted for r/o CVA. MRI showed an infarct in the right frontal lobe and remote hemorhage in the thalamus and bilater basal ganglia infarcts.     Pertinent Vitals Pain Assessment: No/denies pain  SLP Plan  Continue with current plan of care    Recommendations                Plan: Continue with current plan of care   Bertel Venard L. Samson Fredericouture, [place] CCC/SLP Pager [phone]      Blenda MountsCouture, Treven Holtman Laurice 03/08/2015, 3:25 PM

## 2015-03-08 NOTE — Progress Notes (Addendum)
   03/08/15 1453  Vitals  Temp 97.9 F (36.6 C)  Temp Source Oral  BP (!) 117/55 mmHg  Pulse Rate 75  Resp (!) 32  Oxygen Therapy  SpO2 100 %  O2 Device Nasal Cannula  O2 Flow Rate (L/min) 2 L/min  Pain Assessment  Pain Assessment No/denies pain  Pain Score 0  Pt arrived to 4N22 at 1455.  Telemetry applied and CCMD notified. Pt A&O x 2 (self and place only) Foley D/C at 1000 and pt is due to void by 1600.  See above or V/S, no complaints of pain. Pt without distress. Diet ordered, will monitor.

## 2015-03-08 NOTE — Progress Notes (Signed)
Report received to 4N. Pt transferred to room 22. Pt understand and daughter made aware. VSS. Pt received by RN.

## 2015-03-08 NOTE — Progress Notes (Signed)
Patient voided incontinently at 1510 with PVR of . MD notified with orders recd.
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
## 2015-03-08 NOTE — Progress Notes (Signed)
[facility] PROGRESS NOTE   Camelia PhenesJames L Miltenberger UEA:[id number] DOB: 04/28/1921 DOA: 03/05/2015 PCP: Kristian CoveyBURCHETTE,BRUCE W, MD  HPI/Subjective: Seen with daughter at bedside, awake and alert. Denies any significant complaints. Waiting for the bed, has to be 24 hours off of restraints. DC Foley catheter, DC restraints, SNF in a.m. probably.  Assessment/Plan: Principal Problem:   CVA (cerebral infarction) Active Problems:   Essential hypertension   Hyperglycemia   Leg weakness   Closed left humeral fracture   Left leg weakness   Acute delirium    CVA Patient presented with left-sided weakness, difficulty with walking. CT did not show bleeding, MRI of the brain showed acute stroke involving right posterior frontal lobe (R MCA territory). Neurology consulted and recommended to continue stroke workup. lipid profile, hemoglobin A1c. Bilateral carotid duplex, telemetry to rule out arrhythmias. Per neurology aspirin and Plavix.  Acute delirium Patient had confusion last night, per family this is happens sometimes at home. This is likely sundowning, likely baseline dementia complicated by acute delirium  Elevated troponin Troponin elevated at 0.7, 1.6 and 1.38. Patient denies any chest pain, denies any palpitations. EKG did not show evidence of acute ischemia, has RBBB. Discussed with Dr. Myrtis SerKatz over the phone, likely troponin elevation likely secondary to acute stroke. Heparin discontinued, fear of transformation of CVA into hemorrhagic one, continue beta blockers.  Left humeral fracture Patient had left surgical neck humeral fracture since a week before Easter, wearing a sling for it. He follows with Dr. Ophelia CharterYates. Chest x-ray still showing nonhealing.  Essential hypertension Continue medications.  Hyperglycemia Peak blood sugar of 150, appears to be postprandial, hemoglobin A1c is 5.2, NOT consistent with diabetes  Code Status: Full Code Family Communication: Plan discussed with  the patient. Disposition Plan: Remains inpatient Diet: Diet Heart Room service appropriate?: Yes; Fluid consistency:: Thin  Consultants:  Neurology  Procedures:  Carotid duplex: Bilateral vertebrals antegrade flow, 1-39% stenosis in both carotid arteries.  Antibiotics:  None   Objective: Filed Vitals:   03/08/15 1207  BP: 118/47  Pulse: 77  Temp: 97.2 F (36.2 C)  Resp: 36    Intake/Output Summary (Last 24 hours) at 03/08/15 1328 Last data filed at 03/08/15 0900  Gross per 24 hour  Intake    650 ml  Output   1300 ml  Net   -650 ml   Filed Weights   03/05/15 1618 03/05/15 2115  Weight: 70.761 kg (156 lb) 71.8 kg (158 lb 4.6 oz)    Exam: General: Alert and awake, oriented x3, not in any acute distress. HEENT: anicteric sclera, pupils reactive to light and accommodation, EOMI CVS: S1-S2 clear, no murmur rubs or gallops Chest: clear to auscultation bilaterally, no wheezing, rales or rhonchi Abdomen: soft nontender, nondistended, normal bowel sounds, no organomegaly Extremities: no cyanosis, clubbing or edema noted bilaterally Neuro: Cranial nerves II-XII intact, no focal neurological deficits  Data Reviewed: Basic Metabolic Panel:  Recent Labs Lab 03/05/15 1631 03/05/15 1639 03/06/15 0245 03/07/15 0245 03/08/15 0516  NA 138 142 141 138 142  K 3.7 3.7 3.7 3.5 3.4*  CL 102 98* 102 102 101  CO2 29  --  28 25 29   GLUCOSE 150* 147* 134* 130* 105*  BUN 13 16 14 18  29*  CREATININE 1.01 0.90 1.12 1.16 1.17  CALCIUM 8.8*  --  9.2 9.1 9.0   Liver Function Tests:  Recent Labs Lab 03/05/15 1631 03/06/15 0245  AST 18 24  ALT 10* 9*  ALKPHOS 95 100  BILITOT 0.9 1.1  PROT 6.3* 6.1*  ALBUMIN 3.2* 3.1*   No results for input(s): LIPASE, AMYLASE in the last 168 hours. No results for input(s): AMMONIA in the last 168 hours. CBC:  Recent Labs Lab 03/05/15 1631 03/05/15 1639 03/07/15 0245 03/08/15 0230 03/08/15 0516  WBC 5.7  --  11.9* 5.3 8.7    NEUTROABS 3.1  --   --   --   --   HGB 15.2 16.3 16.4 8.0* 16.0  HCT 46.1 48.0 48.5 25.0* 49.2  MCV 100.2*  --  98.8 93.3 100.4*  PLT 160  --  178 190 161   Cardiac Enzymes:  Recent Labs Lab 03/05/15 2305 03/06/15 0245 03/06/15 0913  CKTOTAL  --  106  --   CKMB  --  12.1*  --   TROPONINI 0.70* 1.61* 1.38*   BNP (last 3 results) No results for input(s): BNP in the last 8760 hours.  ProBNP (last 3 results) No results for input(s): PROBNP in the last 8760 hours.  CBG:  Recent Labs Lab 03/05/15 1623  GLUCAP 136*    Micro Recent Results (from the past 240 hour(s))  MRSA PCR Screening     Status: None   Collection Time: 03/05/15 10:05 PM  Result Value Ref Range Status   MRSA by PCR NEGATIVE NEGATIVE Final    Comment:        The GeneXpert MRSA Assay (FDA approved for NASAL specimens only), is one component of a comprehensive MRSA colonization surveillance program. It is not intended to diagnose MRSA infection nor to guide or monitor treatment for MRSA infections.      Studies: No results found.  Scheduled Meds: . aspirin EC  81 mg Oral Daily  . clopidogrel  75 mg Oral Daily  . heparin subcutaneous  5,000 Units Subcutaneous 3 times per day  . hydrochlorothiazide  12.5 mg Oral Daily  . losartan  50 mg Oral Daily  . metoprolol tartrate  12.5 mg Oral BID  . nicotine  7 mg Transdermal Daily  . potassium chloride  40 mEq Oral Once  . sodium chloride  3 mL Intravenous Q12H   Continuous Infusions:      Time spent: 35 minutes    [facility] CenterELMAHI,Onie Kasparek A  [facility] Pager [phone] If 7PM-7AM, please contact night-coverage at [email or web address], password [email or web address] 03/08/2015, 1:28 PM  LOS: 3 days

## 2015-03-08 NOTE — Clinical Social Work Note (Signed)
CSW spoke with the pt's daughter Misty StanleyLisa. CSW presented bed offers to [place]. Misty StanleyLisa was disappointed because [facility] expressed concerns with the pt's current behaviors. Misty StanleyLisa reported that she will discuss bed offers with her mother. CSW will continue to follow and assist.   Remi Rester, MSW, LCSWA [phone]

## 2015-03-09 DIAGNOSIS — I638 Other cerebral infarction: Secondary | ICD-10-CM

## 2015-03-09 DIAGNOSIS — I10 Essential (primary) hypertension: Secondary | ICD-10-CM

## 2015-03-09 DIAGNOSIS — R410 Disorientation, unspecified: Secondary | ICD-10-CM

## 2015-03-09 DIAGNOSIS — S42302A Unspecified fracture of shaft of humerus, left arm, initial encounter for closed fracture: Secondary | ICD-10-CM

## 2015-03-09 LAB — CBC
HEMATOCRIT: 50.4 % (ref 39.0–52.0)
Hemoglobin: 16.9 g/dL (ref 13.0–17.0)
MCH: 33.3 pg (ref 26.0–34.0)
MCHC: 33.5 g/dL (ref 30.0–36.0)
MCV: 99.2 fL (ref 78.0–100.0)
Platelets: 163 10*3/uL (ref 150–400)
RBC: 5.08 MIL/uL (ref 4.22–5.81)
RDW: 12.7 % (ref 11.5–15.5)
WBC: 9.7 10*3/uL (ref 4.0–10.5)

## 2015-03-09 LAB — BASIC METABOLIC PANEL
Anion gap: 11 (ref 5–15)
BUN: 35 mg/dL — AB (ref 6–20)
CO2: 26 mmol/L (ref 22–32)
Calcium: 9.2 mg/dL (ref 8.9–10.3)
Chloride: 102 mmol/L (ref 101–111)
Creatinine, Ser: 1.32 mg/dL — ABNORMAL HIGH (ref 0.61–1.24)
GFR, EST AFRICAN AMERICAN: 52 mL/min — AB (ref >60)
GFR, EST NON AFRICAN AMERICAN: 45 mL/min — AB (ref >60)
Glucose, Bld: 123 mg/dL — ABNORMAL HIGH (ref 65–99)
Potassium: 4.1 mmol/L (ref 3.5–5.1)
Sodium: 139 mmol/L (ref 135–145)

## 2015-03-09 MED ORDER — TAMSULOSIN HCL 0.4 MG PO CAPS
0.4000 mg | ORAL_CAPSULE | Freq: Every day | ORAL | Status: DC
  Administered 2015-03-09 – 2015-03-10 (×2): 0.4 mg via ORAL
  Filled 2015-03-09 (×2): qty 1

## 2015-03-09 NOTE — Progress Notes (Signed)
Physical Therapy Treatment Patient Details Name: Ronald PhenesJames L [name] MRN: [id number] DOB: 10/31/1920 Today's Date: 03/09/2015    History of Present Illness 81 yo male with htn, cva, left vertebral artery stenosis on prior MRA apparently c/o left leg weakness starting this am. Pt with L humeral fracture as well. CT brain negative.  As of 5/16 evaluation, no spontaneous or voluntary movement on L side, upper or lower.    PT Comments    Patient initially lethargic however became more alert and eager to work with therapy. Focused on seated and standing balance activities today. He does require max assist for mobility however remains motivated and willing to try all tasks requested by PT. Patient will continue to benefit from skilled physical therapy services to further improve independence with functional mobility.  Follow Up Recommendations  SNF;Supervision/Assistance - 24 hour     Equipment Recommendations  None recommended by PT    Recommendations for Other Services       Precautions / Restrictions Precautions Precautions: Fall Required Braces or Orthoses: Sling Restrictions Weight Bearing Restrictions: Yes LUE Weight Bearing: Non weight bearing    Mobility  Bed Mobility Overal bed mobility: Needs Assistance Bed Mobility: Supine to Sit     Supine to sit: Max assist;HOB elevated     General bed mobility comments: Max assist for truncal support to assume seated position. Leans heavily to posterior and Left. More assist for LLE, able to follow to edge of bed with RLE.  Transfers Overall transfer level: Needs assistance Equipment used:  (Back of locked recliner chair.) Transfers: Sit to/from Starwood HotelsStand;Squat Pivot Transfers Sit to Stand: Max assist   Squat pivot transfers: Max assist     General transfer comment: Max assist for boost to stand with Lt knee block from PT due to instability. Tolerated x 45 seconds with tactile cues to facilitate upright posture, pt rotating towards  left, corrected with pressure at ches and left buttock, stabilized himelf with RUE holding onto back of recliner chair for support. Max assist for squat pivot transfer with LLE block from PT. Good buttocks clerance during transfer.  Ambulation/Gait                 Stairs            Wheelchair Mobility    Modified Rankin (Stroke Patients Only) Modified Rankin (Stroke Patients Only) Pre-Morbid Rankin Score: Moderate disability Modified Rankin: Severe disability     Balance Overall balance assessment: Needs assistance Sitting-balance support: Single extremity supported Sitting balance-Leahy Scale: Poor Sitting balance - Comments: Facilitation techniques for upright posture, pt leaning heavily to left and posterior at times. Able to pull self onto Rt elbow with some assist and maintain this position x1 min x3. Used handle on back of recliner for pt to orient upright position and correct posture due to blindness. Able to sit approx 10 min on edge of bed with intermittent assist for stability. Responds well to hand over hand guidance.   Standing balance support: Single extremity supported (Lt knee block, support through Lt trunk rather than LUE) Standing balance-Leahy Scale: Poor Standing balance comment: Tolerated standing x 45 seconds, leans to left with left rotation. Mod assist to maintain standing, does a good job of using Rt side for support. Lt knee block required.                    Cognition Arousal/Alertness: Lethargic (initially lethargic, improved with mobility) Behavior During Therapy: Flat affect (cooperative) Overall Cognitive Status: Impaired/Different  from baseline Area of Impairment: Following commands;Attention;Problem solving   Current Attention Level: Sustained   Following Commands: Follows one step commands with increased time;Follows one step commands consistently;Follows multi-step commands inconsistently;Follows multi-step commands with  increased time     Problem Solving: Slow processing;Difficulty sequencing;Requires verbal cues;Requires tactile cues General Comments: Pt stated he was in the hospital, has been through this x3 in the past,and will work hard to get back on his feet.    Exercises      General Comments General comments (skin integrity, edema, etc.): Good awareness of deficits, feels when he is leaning left and posteriorly. Focused on correcting these postural deficits.      Pertinent Vitals/Pain Pain Assessment: 0-10 Pain Score:  ("I'm not too bad" no value given) Pain Descriptors / Indicators:  (Not stated) Pain Intervention(s): Monitored during session;Repositioned    Home Living                      Prior Function            PT Goals (current goals can now be found in the care plan section) Acute Rehab PT Goals Patient Stated Goal: home PT Goal Formulation: With patient Time For Goal Achievement: 03/13/15 Potential to Achieve Goals: Fair Progress towards PT goals: Progressing toward goals    Frequency  Min 3X/week    PT Plan Current plan remains appropriate    Co-evaluation             End of Session Equipment Utilized During Treatment: Gait belt Activity Tolerance: Patient tolerated treatment well Patient left: in chair;with call bell/phone within reach;with SCD's reapplied     Time: 1630-1706 PT Time Calculation (min) (ACUTE ONLY): 36 min  Charges:  $Therapeutic Activity: 23-37 mins                    G Codes[place]:      Daryll Spisak S 03/09/2015, 5:35 PM Sunday SpillersLogan Secor Dou[place], [place] [phone]

## 2015-03-09 NOTE — Clinical Social Work Note (Signed)
CSW spoke with pt's daughter Misty StanleyLisa. Misty StanleyLisa reported that [facility]. CSW informed both Marcelino DusterMichelle and Misty StanleyLisa to contact each other to set up a time to complete admission paperwork for the pt. CSW will continue to assist and follow pt.   Keandrea Tapley, MSW, LCSWA [phone]

## 2015-03-09 NOTE — Progress Notes (Addendum)
[facility] PROGRESS NOTE   Ronald [name] ZOX:[id number] DOB: 11/11/1920 DOA: 03/05/2015 PCP: Ronald CoveyBURCHETTE,BRUCE W, MD  HPI/Subjective: Sleeping this am, confused, agitated overnight , sitter placed Having urinary retention since foley Dced  Assessment/Plan:  Acute CVA Patient presented with left Hemiparesis and facial weakness CT did not show bleeding, MRI of the brain showed acute stroke involving right posterior frontal lobe (R MCA territory). -LSL 58, hemoglobin A1c 5.2. -Carotid duplex-no significant stenosis -ECHo with normal EF and no thrombus -Per neurology continue aspirin and Plavix.  Acute delirium Sundowning again last pm, has baseline dementia complicated by acute delirium -also check UA given urinary retention -DC sitter -received 2 doses of ativan last pm  Elevated troponin Troponin elevated at 0.7, 1.6 and 1.38. Patient denies any chest pain, denies any palpitations. EKG did not show evidence of acute ischemia, has RBBB. Dr.Elmahi discussed with Dr. Myrtis SerKatz over the phone, likely troponin elevation likely secondary to acute stroke. -was briefly was Heparin then discontinued due to fear of hemorrhagic transformation of CVA -continue beta blockers, ASA  Left humeral fracture Patient had left surgical neck humeral fracture since a week before Easter, wearing a sling for it. He follows with Dr. Ophelia CharterYates. Chest x-ray still showing nonhealing.  Urinary retention -start flomax, I/O cath PRN if high residual  Essential hypertension -BP soft, stop HCTZ, continue cozaar for now  Hyperglycemia Peak blood sugar of 150, likely postprandial, hemoglobin A1c is 5.2, NOT consistent with diabetes  Code Status: Full Code Family Communication: none at bedside, will call daughter Disposition Plan: SNF tomorrow if stable Diet: Diet Heart Room service appropriate?: Yes; Fluid consistency:: Thin  Consultants:  Neurology  Procedures:  Carotid duplex: Bilateral vertebrals  antegrade flow, 1-39% stenosis in both carotid arteries.  Antibiotics:  None   Objective: Filed Vitals:   03/09/15 0954  BP: 115/68  Pulse: 62  Temp: 97.7 F (36.5 C)  Resp: 20    Intake/Output Summary (Last 24 hours) at 03/09/15 1019 Last data filed at 03/09/15 0100  Gross per 24 hour  Intake    240 ml  Output    800 ml  Net   -560 ml   Filed Weights   03/05/15 1618 03/05/15 2115  Weight: 70.761 kg (156 lb) 71.8 kg (158 lb 4.6 oz)    Exam: General: sleeping, arousible, somewhat confused HEENT: anicteric sclera, pupils reactive to light and accommodation, EOMI CVS: S1-S2 clear, no murmur rubs or gallops Chest: clear to auscultation bilaterally, no wheezing, rales or rhonchi Abdomen: soft nontender, nondistended, normal bowel sounds, no organomegaly Extremities: no cyanosis, clubbing or edema noted bilaterally Neuro: Cranial nerves II-XII intact, no focal neurological deficits  Data Reviewed: Basic Metabolic Panel:  Recent Labs Lab 03/05/15 1631 03/05/15 1639 03/06/15 0245 03/07/15 0245 03/08/15 0516 03/09/15 0607  NA 138 142 141 138 142 139  K 3.7 3.7 3.7 3.5 3.4* 4.1  CL 102 98* 102 102 101 102  CO2 29  --  28 25 29 26   GLUCOSE 150* 147* 134* 130* 105* 123*  BUN 13 16 14 18  29* 35*  CREATININE 1.01 0.90 1.12 1.16 1.17 1.32*  CALCIUM 8.8*  --  9.2 9.1 9.0 9.2   Liver Function Tests:  Recent Labs Lab 03/05/15 1631 03/06/15 0245  AST 18 24  ALT 10* 9*  ALKPHOS 95 100  BILITOT 0.9 1.1  PROT 6.3* 6.1*  ALBUMIN 3.2* 3.1*   No results for input(s): LIPASE, AMYLASE in the last 168 hours. No results for input(s): AMMONIA  in the last 168 hours. CBC:  Recent Labs Lab 03/05/15 1631 03/05/15 1639 03/07/15 0245 03/08/15 0230 03/08/15 0516 03/09/15 0607  WBC 5.7  --  11.9* 5.3 8.7 9.7  NEUTROABS 3.1  --   --   --   --   --   HGB 15.2 16.3 16.4 8.0* 16.0 16.9  HCT 46.1 48.0 48.5 25.0* 49.2 50.4  MCV 100.2*  --  98.8 93.3 100.4* 99.2  PLT 160   --  178 190 161 163   Cardiac Enzymes:  Recent Labs Lab 03/05/15 2305 03/06/15 0245 03/06/15 0913  CKTOTAL  --  106  --   CKMB  --  12.1*  --   TROPONINI 0.70* 1.61* 1.38*   BNP (last 3 results) No results for input(s): BNP in the last 8760 hours.  ProBNP (last 3 results) No results for input(s): PROBNP in the last 8760 hours.  CBG:  Recent Labs Lab 03/05/15 1623  GLUCAP 136*    Micro Recent Results (from the past 240 hour(s))  MRSA PCR Screening     Status: None   Collection Time: 03/05/15 10:05 PM  Result Value Ref Range Status   MRSA by PCR NEGATIVE NEGATIVE Final    Comment:        The GeneXpert MRSA Assay (FDA approved for NASAL specimens only), is one component of a comprehensive MRSA colonization surveillance program. It is not intended to diagnose MRSA infection nor to guide or monitor treatment for MRSA infections.      Studies: No results found.  Scheduled Meds: . aspirin EC  81 mg Oral Daily  . clopidogrel  75 mg Oral Daily  . heparin subcutaneous  5,000 Units Subcutaneous 3 times per day  . losartan  50 mg Oral Daily  . metoprolol tartrate  12.5 mg Oral BID  . nicotine  7 mg Transdermal Daily  . sodium chloride  3 mL Intravenous Q12H  . tamsulosin  0.4 mg Oral Daily   Continuous Infusions:      Time spent: 35 minutes    Ronald [name]  [facility] Pager [phone] If 7PM-7AM, please contact night-coverage at [email or web address], password [email or web address] 03/09/2015, 10:19 AM  LOS: 4 days

## 2015-03-10 ENCOUNTER — Ambulatory Visit: Payer: Medicare Other | Admitting: Family Medicine

## 2015-03-10 DIAGNOSIS — R29898 Other symptoms and signs involving the musculoskeletal system: Secondary | ICD-10-CM

## 2015-03-10 DIAGNOSIS — S42302S Unspecified fracture of shaft of humerus, left arm, sequela: Secondary | ICD-10-CM

## 2015-03-10 LAB — BASIC METABOLIC PANEL
Anion gap: 11 (ref 5–15)
BUN: 43 mg/dL — ABNORMAL HIGH (ref 6–20)
CHLORIDE: 103 mmol/L (ref 101–111)
CO2: 27 mmol/L (ref 22–32)
Calcium: 8.8 mg/dL — ABNORMAL LOW (ref 8.9–10.3)
Creatinine, Ser: 1.26 mg/dL — ABNORMAL HIGH (ref 0.61–1.24)
GFR calc non Af Amer: 48 mL/min — ABNORMAL LOW (ref >60)
GFR, EST AFRICAN AMERICAN: 55 mL/min — AB (ref >60)
Glucose, Bld: 100 mg/dL — ABNORMAL HIGH (ref 65–99)
Potassium: 3.8 mmol/L (ref 3.5–5.1)
Sodium: 141 mmol/L (ref 135–145)

## 2015-03-10 MED ORDER — TAMSULOSIN HCL 0.4 MG PO CAPS: 0.4000 mg | ORAL_CAPSULE | Freq: Every day | ORAL | Status: AC

## 2015-03-10 MED ORDER — METOPROLOL TARTRATE 25 MG PO TABS: 12.5000 mg | ORAL_TABLET | Freq: Two times a day (BID) | ORAL | Status: AC

## 2015-03-10 NOTE — Progress Notes (Signed)
[facility]  Report called.  Vitals and assessments were stable.

## 2015-03-10 NOTE — Discharge Summary (Signed)
Physician Discharge Summary  Ronald PhenesJames L [name] VOZ:[id number] DOB: 06/23/1921 DOA: 03/05/2015  PCP: Kristian CoveyBURCHETTE,BRUCE W, MD  Admit date: 03/05/2015 Discharge date: 03/10/2015  Time spent: 45minutes  Recommendations for Outpatient Follow-up:  1. Dr.Sethi in 1 month 2. Voiding Trial in 1 week, Urology referral if fails this 3. Dr.Yates for Left Humerus fracture  Discharge Diagnoses:  Principal Problem:   CVA (cerebral infarction)   Urinary retention   Dementia   Delirium/Sundowning   Essential hypertension   Hyperglycemia   Leg weakness   Closed left humeral fracture   Left leg weakness   Acute delirium   Discharge Condition: stable  Diet recommendation: heart healthy  Filed Weights   03/05/15 1618 03/05/15 2115  Weight: 70.761 kg (156 lb) 71.8 kg (158 lb 4.6 oz)    History of present illness:  Chief Complaint: left leg weakness HPI: 81 yo male with htn, cva, left vertebral artery stenosis on prior MRA apparently c/o left leg weakness starting 5/14 am. Pt had some numbness and tingling too  Pt was brought to ED for evaluation   Hospital Course:  Acute CVA Patient presented with left Hemiparesis and facial weakness -MRI of the brain showed acute stroke involving right posterior frontal lobe (R MCA territory). -LDL 58, hemoglobin A1c 5.2. -Carotid duplex-no significant stenosis -ECHo with normal EF and no thrombus -Per neurology continue aspirin and Plavix -SNF for rehab.  Delirium Sundowning 2nights during hospitalization, has baseline dementia complicated by acute delirium -required sitter 2 nights ago, then discontinued sitter -stopped all sedative agents due to increased day time drowsiness  Elevated troponin Troponin elevated at 0.7, 1.6 and 1.38. Patient denied any chest pain, denies any palpitations. EKG did not show evidence of acute ischemia, has RBBB. Dr.Elmahi discussed with Dr. Myrtis SerKatz over the phone, likely troponin elevation likely secondary to acute  stroke. -was briefly was Heparin then discontinued due to fear of hemorrhagic transformation of CVA -continue beta blockers, ASA, conservative management only  Left humeral fracture Patient had left surgical neck humeral fracture since a week before Easter, wears a sling for it. He follows with Dr. Ophelia CharterYates. Chest x-ray still showing nonhealing.  Urinary retention -started flomax, still with urinary retention, initially had foley catheter and once this was removed he had urinary retention requiring intermittent catherterization -PLaced Foley at discharge, recommend voiding trial in 1 week once more ambulatory, if fails this will need Urology referral  Essential hypertension -BP soft, stopped HCTZ/cozaar , now on low dose beta blocker   Consultations:  Neurology  Discharge Exam: Filed Vitals:   03/10/15 0851  BP: 123/76  Pulse: 76  Temp: 97.8 F (36.6 C)  Resp: 20    General: Alert, awake, oriented to self and place only Cardiovascular: S1S2/RRR Respiratory: CTAB  Discharge Instructions   Discharge Instructions    Diet - low sodium heart healthy    Complete by:  As directed      Increase activity slowly    Complete by:  As directed           Current Discharge Medication List    START taking these medications   Details  metoprolol tartrate (LOPRESSOR) 25 MG tablet Take 0.5 tablets (12.5 mg total) by mouth 2 (two) times daily.    tamsulosin (FLOMAX) 0.4 MG CAPS capsule Take 1 capsule (0.4 mg total) by mouth daily.      CONTINUE these medications which have NOT CHANGED   Details  aspirin EC 81 MG tablet Take 81 mg by mouth daily as  needed (pain/headache).    Calcium-Magnesium-Vitamin D (CALCIUM 1200+D3 PO) Take 1 tablet by mouth daily.    clopidogrel (PLAVIX) 75 MG tablet TAKE ONE TABLET BY MOUTH ONCE DAILY Qty: 90 tablet, Refills: 2      STOP taking these medications     losartan-hydrochlorothiazide (HYZAAR) 50-12.5 MG per tablet      ibuprofen  (ADVIL,MOTRIN) 400 MG tablet      oxyCODONE-acetaminophen (PERCOCET/ROXICET) 5-325 MG per tablet        No Known Allergies Follow-up Information    Follow up with Kristian Covey, MD. Schedule an appointment as soon as possible for a visit in 1 week.   Specialty:  Family Medicine   Contact information:   [address] [place]ake [place] [address] [phone]       Follow up with SETHI,PRAMOD, MD. Schedule an appointment as soon as possible for a visit in 1 month.   Specialties:  Neurology, Radiology   Contact information:   [address] [place] [address] [phone]        The results of significant diagnostics from this hospitalization (including imaging, microbiology, ancillary and laboratory) are listed below for reference.    Significant Diagnostic Studies: Dg Chest 1 View  03/06/2015   CLINICAL DATA:  Acute onset of left leg weakness. Initial encounter.  EXAM: CHEST  1 VIEW  COMPARISON:  Chest radiograph performed 05/03/2014, and CT of the chest performed 05/06/2014  FINDINGS: The lungs are well-aerated. Vascular congestion is noted. Mildly increased interstitial markings may reflect mild interstitial edema. Mild left basilar atelectasis is noted. There is no evidence of pleural effusion or pneumothorax.  The cardiomediastinal silhouette is mildly enlarged. A significantly displaced comminuted fracture of the left proximal humeral diaphysis is noted, with approximately 1 shaft width medial displacement of the distal humerus.  IMPRESSION: 1. Vascular congestion and mild cardiomegaly. Mildly increased interstitial markings may reflect mild interstitial edema. Mild left basilar atelectasis noted. 2. Significantly displaced comminuted fracture of the left proximal humeral diaphysis, with approximately 1 shaft width medial displacement of the distal humerus.   Electronically Signed   By: Roanna Raider M.D.   On: 03/06/2015 02:49   Ct Head Wo  Contrast  03/05/2015   CLINICAL DATA:  Code stroke. Acute onset of left-sided weakness. Recent fall. Initial encounter.  EXAM: CT HEAD WITHOUT CONTRAST  TECHNIQUE: Contiguous axial images were obtained from the base of the skull through the vertex without intravenous contrast.  COMPARISON:  CT of the head performed 01/12/2009  FINDINGS: There is no evidence of acute infarction, mass lesion, or intra- or extra-axial hemorrhage on CT.  Prominence of the ventricles and sulci reflects mild cortical volume loss. Cerebellar atrophy is noted. Scattered periventricular white matter change likely reflects small vessel ischemic microangiopathy. A chronic lacunar infarct is noted at the left cerebellar hemisphere. Chronic ischemic change is noted at the basal ganglia and thalami bilaterally.  The brainstem and fourth ventricle are within normal limits. The cerebral hemispheres demonstrate grossly normal gray-white differentiation. No mass effect or midline shift is seen.  There is no evidence of fracture; scattered nonspecific lucencies within the visualized osseous structures appear grossly stable from 2010. The visualized portions of the orbits are within normal limits. The patient is status post bilateral maxillary antrostomy. Mild mucosal thickening is noted at the right maxillary sinus. There is opacification of the right frontal sinus, and partial opacification of the mastoid air cells bilaterally. The remaining paranasal sinuses are well-aerated. No significant soft tissue  abnormalities are seen.  IMPRESSION: 1. No acute intracranial pathology seen on CT. 2. Mild cortical volume loss and scattered small vessel ischemic microangiopathy. 3. Chronic ischemic change at the basal ganglia and thalami bilaterally. Chronic lacunar infarct at the left cerebellar hemisphere. 4. Mild mucosal thickening at the right maxillary sinus, opacification of the right frontal sinus, and partial opacification of the mastoid air cells  bilaterally.   Electronically Signed   By: Roanna Raider M.D.   On: 03/05/2015 17:27   Mr Brain Wo Contrast  03/06/2015   CLINICAL DATA:  Acute onset LEFT-sided weakness, paresthesias. History of dementia, hypertension and stroke. Known LEFT vertebral artery stenosis.  EXAM: MRI HEAD WITHOUT CONTRAST  MRA HEAD WITHOUT CONTRAST  TECHNIQUE: Multiplanar, multiecho pulse sequences of the brain and surrounding structures were obtained without intravenous contrast. Angiographic images of the head were obtained using MRA technique without contrast.  COMPARISON:  CT of the head Mar 05, 2015  FINDINGS: MRI HEAD FINDINGS  11 mm area patchy reduced diffusion within RIGHT posterior frontal lobe, precentral gyrus. Corresponding low ADC value. Minimal T2 hyperintense signal.  A few punctate foci of susceptibility artifact in the periphery of the cerebellum and, subcentimeter focus of susceptibility artifact in LEFT thalamus. Ventricles and sulci are normal for patient's age. Remote LEFT thalamus, bilateral basal ganglia lacunar infarcts. Remote small LEFT cerebellar infarct. Patchy to confluent supratentorial white matter FLAIR T2 hyperintensities. No midline shift, mass effect or mass lesions. Multiple tiny perivascular spaces in the basal ganglia.  No abnormal extra-axial fluid collections. Bilateral ocular lens implant. Soft tissue opacifies the RIGHT frontal sinus, sequelae of chronic sinusitis, status post FESS. Small bilateral mastoid effusions. No abnormal sellar expansion. No cerebellar tonsillar ectopia. No suspicious calvarial bone marrow signal. Patient appears edentulous.  MRA HEAD FINDINGS (decreased signal to noise ratio with the skullbase may be related to motion)  Anterior circulation: Normal flow related enhancement of the cervical, petrous, cavernous and supra clinoid internal carotid arteries bilaterally. Anterior communicating arteries is patent. Normal flow related enhancement of the anterior and middle  cerebral arteries.  Posterior circulation: Low signal to noise ratio. Limited assessment for subtle vertebral artery luminal regularity. RIGHT vertebral artery appears dominant, flow related enhancement within the basilar artery, which is widely patent. Gradual tapering of the distal LEFT V4 segment. Robust bilateral posterior communicating arteries contributing to the posterior circulation with flow related enhancement within the bilateral posterior cerebral arteries.  No high-grade stenosis, aneurysm, suspicious luminal irregularity  IMPRESSION: MRI HEAD: Small area of acute ischemia within RIGHT posterior frontal lobe (middle cerebral artery territory).  Involutional changes. Moderate to severe white matter changes suggest chronic small vessel ischemic disease.  Remote LEFT thalamic hemorrhagic infarct. Bilateral basal ganglia lacunar infarcts.  MRA HEAD: Generally decreased signal to noise ratio limits evaluation without large vessel occlusion. Possible stenosis of the distal LEFT V4 segment versus artifact.   Electronically Signed   By: Awilda Metro   On: 03/06/2015 03:02   Mr Maxine Glenn Head/brain Wo Cm  03/06/2015   CLINICAL DATA:  Acute onset LEFT-sided weakness, paresthesias. History of dementia, hypertension and stroke. Known LEFT vertebral artery stenosis.  EXAM: MRI HEAD WITHOUT CONTRAST  MRA HEAD WITHOUT CONTRAST  TECHNIQUE: Multiplanar, multiecho pulse sequences of the brain and surrounding structures were obtained without intravenous contrast. Angiographic images of the head were obtained using MRA technique without contrast.  COMPARISON:  CT of the head Mar 05, 2015  FINDINGS: MRI HEAD FINDINGS  11 mm area patchy reduced  diffusion within RIGHT posterior frontal lobe, precentral gyrus. Corresponding low ADC value. Minimal T2 hyperintense signal.  A few punctate foci of susceptibility artifact in the periphery of the cerebellum and, subcentimeter focus of susceptibility artifact in LEFT thalamus.  Ventricles and sulci are normal for patient's age. Remote LEFT thalamus, bilateral basal ganglia lacunar infarcts. Remote small LEFT cerebellar infarct. Patchy to confluent supratentorial white matter FLAIR T2 hyperintensities. No midline shift, mass effect or mass lesions. Multiple tiny perivascular spaces in the basal ganglia.  No abnormal extra-axial fluid collections. Bilateral ocular lens implant. Soft tissue opacifies the RIGHT frontal sinus, sequelae of chronic sinusitis, status post FESS. Small bilateral mastoid effusions. No abnormal sellar expansion. No cerebellar tonsillar ectopia. No suspicious calvarial bone marrow signal. Patient appears edentulous.  MRA HEAD FINDINGS (decreased signal to noise ratio with the skullbase may be related to motion)  Anterior circulation: Normal flow related enhancement of the cervical, petrous, cavernous and supra clinoid internal carotid arteries bilaterally. Anterior communicating arteries is patent. Normal flow related enhancement of the anterior and middle cerebral arteries.  Posterior circulation: Low signal to noise ratio. Limited assessment for subtle vertebral artery luminal regularity. RIGHT vertebral artery appears dominant, flow related enhancement within the basilar artery, which is widely patent. Gradual tapering of the distal LEFT V4 segment. Robust bilateral posterior communicating arteries contributing to the posterior circulation with flow related enhancement within the bilateral posterior cerebral arteries.  No high-grade stenosis, aneurysm, suspicious luminal irregularity  IMPRESSION: MRI HEAD: Small area of acute ischemia within RIGHT posterior frontal lobe (middle cerebral artery territory).  Involutional changes. Moderate to severe white matter changes suggest chronic small vessel ischemic disease.  Remote LEFT thalamic hemorrhagic infarct. Bilateral basal ganglia lacunar infarcts.  MRA HEAD: Generally decreased signal to noise ratio limits evaluation  without large vessel occlusion. Possible stenosis of the distal LEFT V4 segment versus artifact.   Electronically Signed   By: Awilda Metro   On: 03/06/2015 03:02    Microbiology: Recent Results (from the past 240 hour(s))  MRSA PCR Screening     Status: None   Collection Time: 03/05/15 10:05 PM  Result Value Ref Range Status   MRSA by PCR NEGATIVE NEGATIVE Final    Comment:        The GeneXpert MRSA Assay (FDA approved for NASAL specimens only), is one component of a comprehensive MRSA colonization surveillance program. It is not intended to diagnose MRSA infection nor to guide or monitor treatment for MRSA infections.      Labs: Basic Metabolic Panel:  Recent Labs Lab 03/06/15 0245 03/07/15 0245 03/08/15 0516 03/09/15 0607 03/10/15 0638  NA 141 138 142 139 141  K 3.7 3.5 3.4* 4.1 3.8  CL 102 102 101 102 103  CO2 28 25 29 26 27   GLUCOSE 134* 130* 105* 123* 100*  BUN 14 18 29* 35* 43*  CREATININE 1.12 1.16 1.17 1.32* 1.26*  CALCIUM 9.2 9.1 9.0 9.2 8.8*   Liver Function Tests:  Recent Labs Lab 03/05/15 1631 03/06/15 0245  AST 18 24  ALT 10* 9*  ALKPHOS 95 100  BILITOT 0.9 1.1  PROT 6.3* 6.1*  ALBUMIN 3.2* 3.1*   No results for input(s): LIPASE, AMYLASE in the last 168 hours. No results for input(s): AMMONIA in the last 168 hours. CBC:  Recent Labs Lab 03/05/15 1631 03/05/15 1639 03/07/15 0245 03/08/15 0230 03/08/15 0516 03/09/15 0607  WBC 5.7  --  11.9* 5.3 8.7 9.7  NEUTROABS 3.1  --   --   --   --   --  HGB 15.2 16.3 16.4 8.0* 16.0 16.9  HCT 46.1 48.0 48.5 25.0* 49.2 50.4  MCV 100.2*  --  98.8 93.3 100.4* 99.2  PLT 160  --  178 190 161 163   Cardiac Enzymes:  Recent Labs Lab 03/05/15 2305 03/06/15 0245 03/06/15 0913  CKTOTAL  --  106  --   CKMB  --  12.1*  --   TROPONINI 0.70* 1.61* 1.38*   BNP: BNP (last 3 results) No results for input(s): BNP in the last 8760 hours.  ProBNP (last 3 results) No results for input(s):  PROBNP in the last 8760 hours.  CBG:  Recent Labs Lab 03/05/15 1623  GLUCAP 136*       Signed:  Laiya Wisby  [facility] 03/10/2015, 10:27 AM

## 2015-03-10 NOTE — Clinical Social Work Placement (Signed)
   CLINICAL SOCIAL WORK PLACEMENT  NOTE  Date:  03/10/2015  Patient Details  Name: Ronald [name] MRN: [id number] Date of Birth: 09/20/1921  Clinical Social Work is seeking post-discharge placement for this patient at the Skilled  Nursing Facility level of care (*CSW will initial, date and re-position this form in  chart as items are completed):  Yes   Patient/family provided with [facility] Clinical Social Work Department's list of facilities offering this level of care within the geographic area requested by the patient (or if unable, by the patient's family).  Yes   Patient/family informed of their freedom to choose among providers that offer the needed level of care, that participate in Medicare, Medicaid or managed care program needed by the patient, have an available bed and are willing to accept the patient.  Yes   Patient/family informed of [facility]'s ownership interest in [facility] and [facility], as well as of the fact that they are under no obligation to receive care at these facilities.  PASRR submitted to EDS on 03/07/15     PASRR number received on 03/07/15     Existing PASRR number confirmed on       FL2 transmitted to all facilities in geographic area requested by pt/family on       FL2 transmitted to all facilities within larger geographic area on 03/10/15     Patient informed that his/her managed care company has contracts with or will negotiate with certain facilities, including the following:        Yes   Patient/family informed of bed offers received.  [facility]     Physician recommends and patient chooses bed at      [facility].  Patient to be transferred to facility by [facility]      Patient family notified on 03/10/15 of transfer.  Name of family member notified:  Shinn,Lisa     PHYSICIAN       Additional  Comment:    _______________________________________________ Gwynne EdingerBibbs, Adem Costlow, LCSW 03/10/2015, 10:24 AM

## 2015-03-10 NOTE — Progress Notes (Signed)
Report called and given to Lupita Leashonna, Charity fundraiserN at [facility].

## 2015-04-19 ENCOUNTER — Ambulatory Visit: Payer: Self-pay | Admitting: Neurology

## 2015-04-22 DEATH — deceased

## 2015-10-31 ENCOUNTER — Encounter: Payer: Self-pay | Admitting: Internal Medicine

## 2016-07-11 IMAGING — MR MR HEAD W/O CM
9 of 12 series · 29 of 48 positions shown · non-contrast
Comparison: CT of the head March 05, 2015

CLINICAL DATA: Acute onset LEFT-sided weakness, paresthesias.
History of dementia, hypertension and stroke. Known LEFT vertebral
artery stenosis.

EXAM:
MRI HEAD WITHOUT CONTRAST
MRA HEAD WITHOUT CONTRAST
TECHNIQUE: Multiplanar, multiecho pulse sequences of the brain and surrounding
structures were obtained without intravenous contrast. Angiographic
images of the head were obtained using MRA technique without
contrast.

[Series 4: DWI · axial · 3.0mm · 1.09mm/px · z∈[-42,+97]mm · 6 of 96 slices shown (1 of 4)]
[im 1/96]
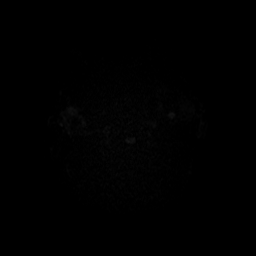
[im 20/96]
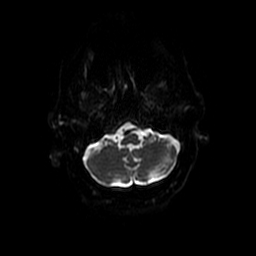
[im 39/96]
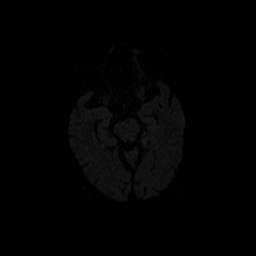
[im 58/96]
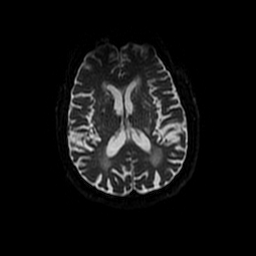
[im 77/96]
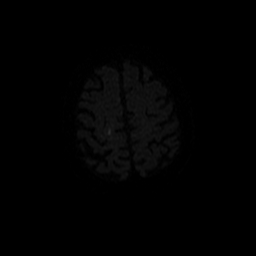
[im 96/96]
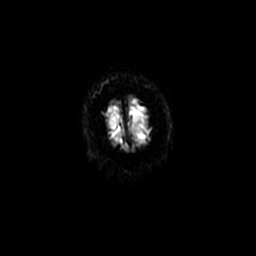

[Series 5: (id) mt fs · axial · 1.2mm · 0.43mm/px · z∈[-68,-25]mm · 4 of 174 slices shown]
[im 1/174]
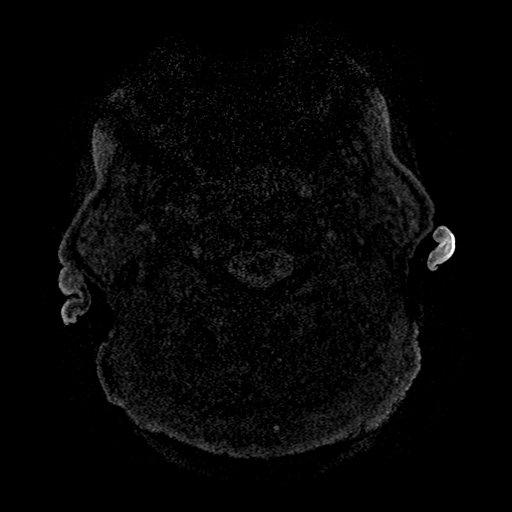
[im 29/174]
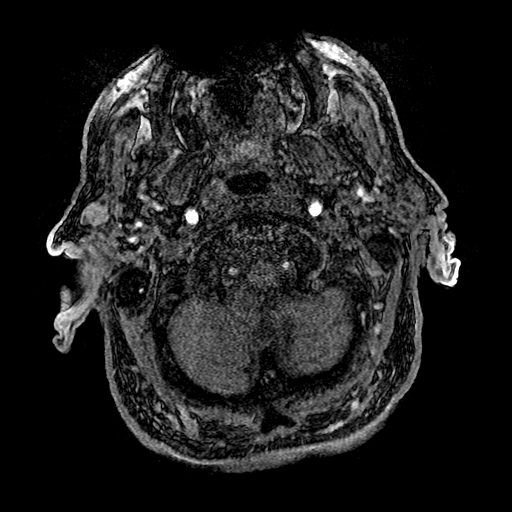
[im 58/174]
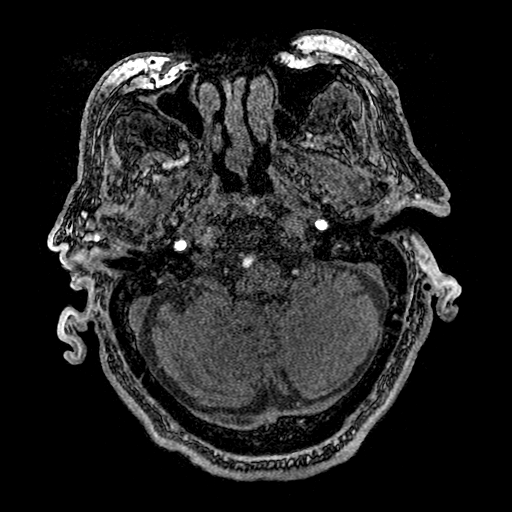
[im 73/174]
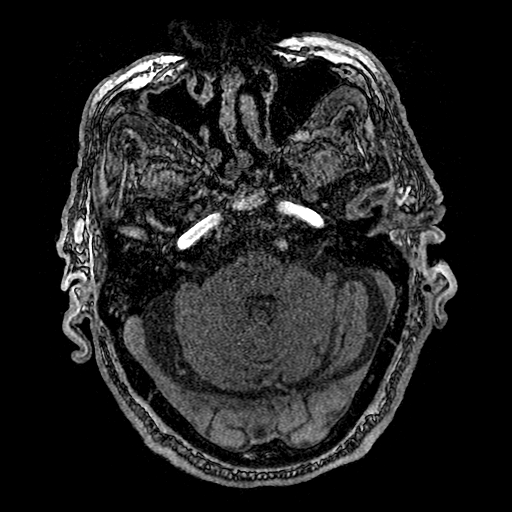

[Series 6: T2 · axial · 5.0mm · 0.43mm/px · z∈[-38,+96]mm · 2 of 24 slices shown (1 of 2)]
[im 1/24]
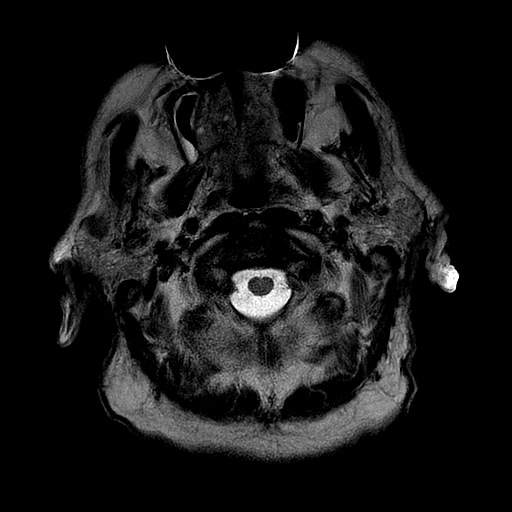
[im 24/24]
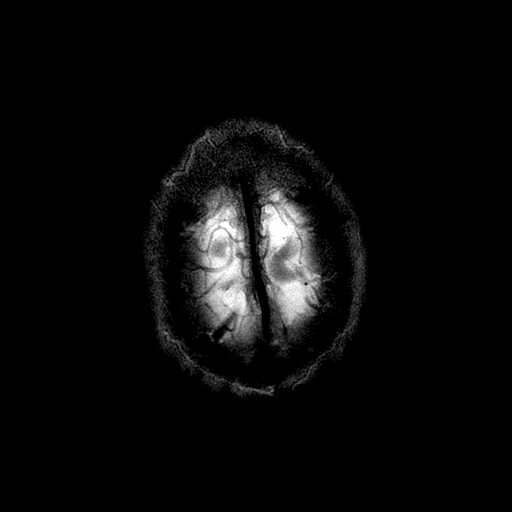

[Series 7: FLAIR · axial · 5.0mm · 0.43mm/px · z∈[-38,+96]mm · 2 of 24 slices shown]
[im 1/24]
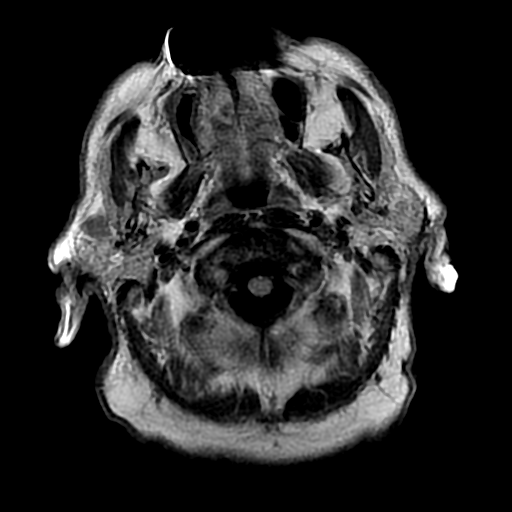
[im 24/24]
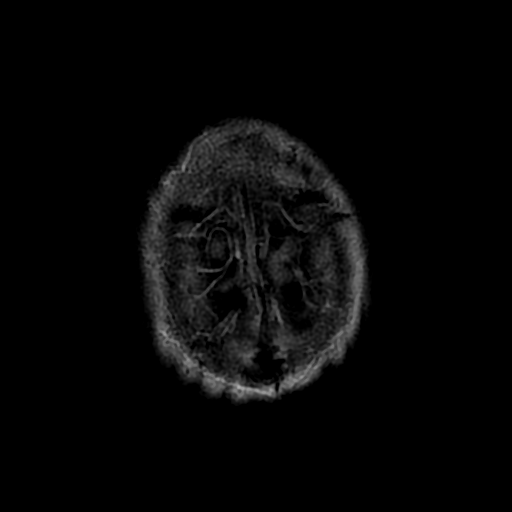

[Series 8: T2 · coronal · 5.0mm · 0.43mm/px · 2 of 28 slices shown (2 of 2)]
[im 1/28]
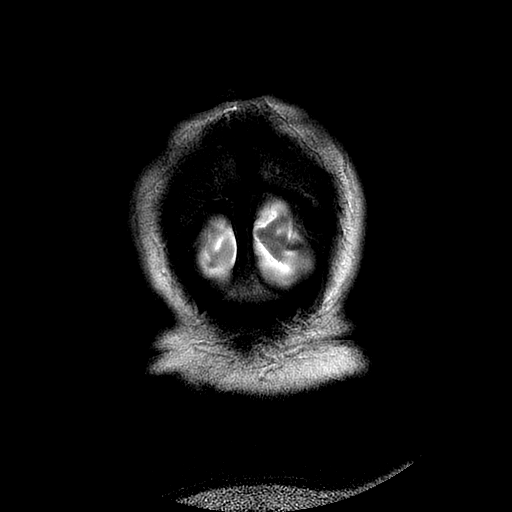
[im 28/28]
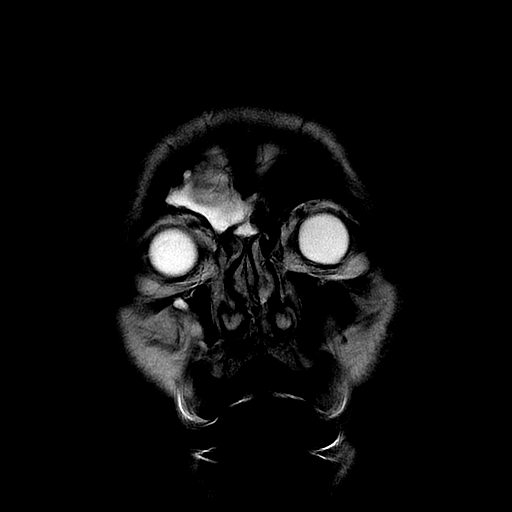

[Series 9: T1 · sagittal · 5.0mm · 0.47mm/px · 2 of 23 slices shown]
[im 1/23]
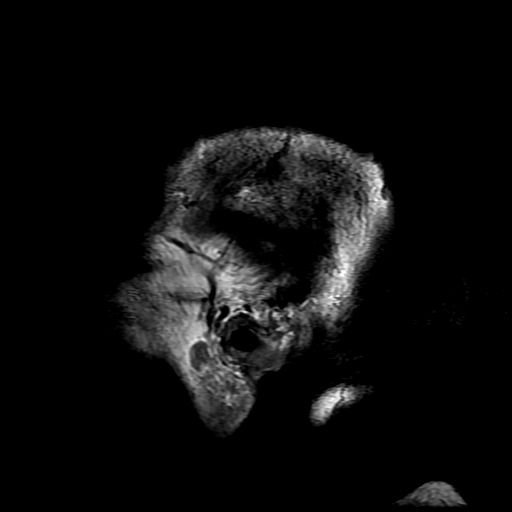
[im 23/23]
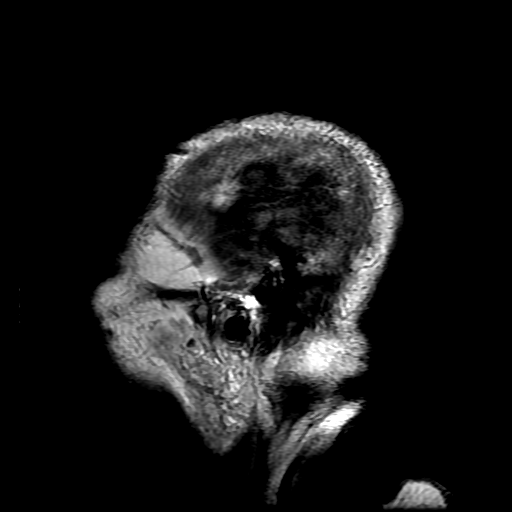

[Series 10: DWI · coronal · 5.0mm · 1.09mm/px · 5 of 66 slices shown (2 of 4)]
[im 1/66]
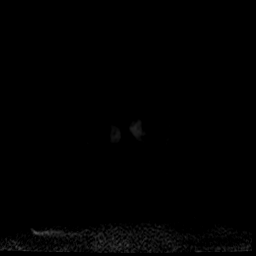
[im 17/66]
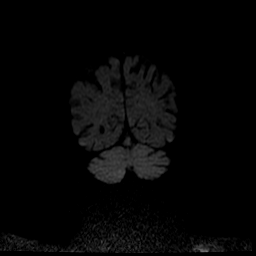
[im 33/66]
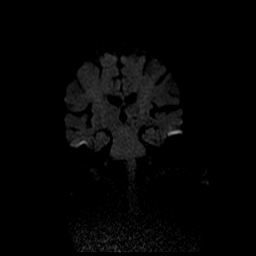
[im 49/66]
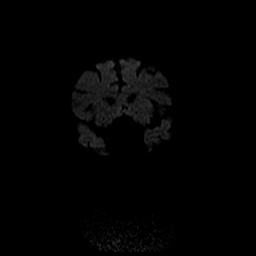
[im 66/66]
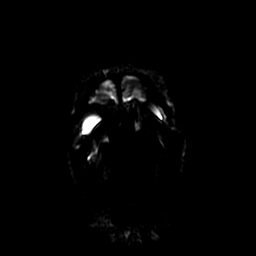

[Series 400: DWI · axial · 3.0mm · 1.09mm/px · z∈[-42,+97]mm · 4 of 48 slices shown (3 of 4)]
[im 1/48]
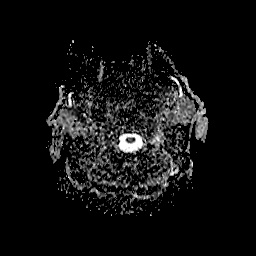
[im 16/48]
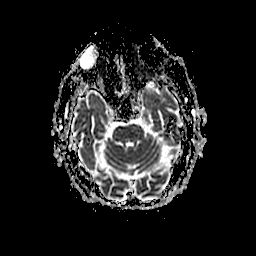
[im 32/48]
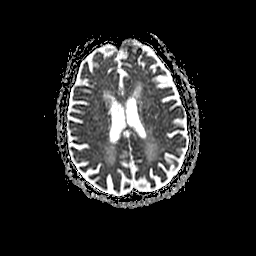
[im 48/48]
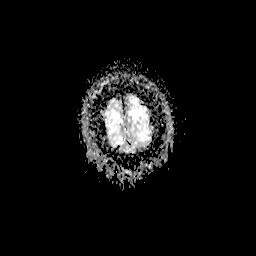

[Series 1000: DWI · coronal · 5.0mm · 1.09mm/px · 2 of 33 slices shown (4 of 4)]
[im 1/33]
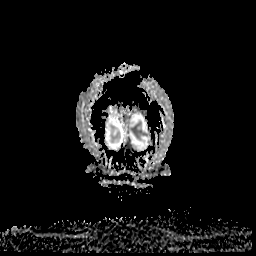
[im 33/33]
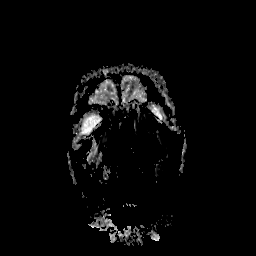

[29 of 48 positions shown; findings below may reference images not displayed]

FINDINGS: MRI HEAD FINDINGS

11 mm area patchy reduced diffusion within RIGHT posterior frontal
lobe, precentral gyrus. Corresponding low ADC value. Minimal T2
hyperintense signal.

A few punctate foci of susceptibility artifact in the periphery of
the cerebellum and, subcentimeter focus of susceptibility artifact
in LEFT thalamus. Ventricles and sulci are normal for patient's age.
Remote LEFT thalamus, bilateral basal ganglia lacunar infarcts.
Remote small LEFT cerebellar infarct. Patchy to confluent
supratentorial white matter FLAIR T2 hyperintensities. No midline
shift, mass effect or mass lesions. Multiple tiny perivascular
spaces in the basal ganglia.

No abnormal extra-axial fluid collections. Bilateral ocular lens
implant. Soft tissue opacifies the RIGHT frontal sinus, sequelae of
chronic sinusitis, status post FESS. Small bilateral mastoid
effusions. No abnormal sellar expansion. No cerebellar tonsillar
ectopia. No suspicious calvarial bone marrow signal. Patient appears
edentulous.

MRA HEAD FINDINGS (decreased signal to noise ratio with the
skullbase may be related to motion)

Anterior circulation: Normal flow related enhancement of the
cervical, petrous, cavernous and supra clinoid internal carotid
arteries bilaterally. Anterior communicating arteries is patent.
Normal flow related enhancement of the anterior and middle cerebral
arteries.

Posterior circulation: Low signal to noise ratio. Limited assessment
for subtle vertebral artery luminal regularity. RIGHT vertebral
artery appears dominant, flow related enhancement within the basilar
artery, which is widely patent. Gradual tapering of the distal LEFT
V4 segment. Robust bilateral posterior communicating arteries
contributing to the posterior circulation with flow related
enhancement within the bilateral posterior cerebral arteries.

No high-grade stenosis, aneurysm, suspicious luminal irregularity
IMPRESSION: MRI HEAD: Small area of acute ischemia within RIGHT posterior
frontal lobe (middle cerebral artery territory).

Involutional changes. Moderate to severe white matter changes
suggest chronic small vessel ischemic disease.

Remote LEFT thalamic hemorrhagic infarct. Bilateral basal ganglia
lacunar infarcts.

MRA HEAD: Generally decreased signal to noise ratio limits
evaluation without large vessel occlusion. Possible stenosis of the
distal LEFT V4 segment versus artifact.

By: Syvert Davanger
